# Patient Record
Sex: Male | Born: 1955 | Race: White | Hispanic: No | Marital: Married | Smoking: Never smoker
Health system: Southern US, Community
[De-identification: ages and names within clinical notes are randomized; demographics above are authoritative.]

## PROBLEM LIST (undated history)

## (undated) DIAGNOSIS — Z87442 Personal history of urinary calculi: Secondary | ICD-10-CM

## (undated) DIAGNOSIS — H9190 Unspecified hearing loss, unspecified ear: Secondary | ICD-10-CM

## (undated) DIAGNOSIS — G4700 Insomnia, unspecified: Secondary | ICD-10-CM

## (undated) DIAGNOSIS — K760 Fatty (change of) liver, not elsewhere classified: Secondary | ICD-10-CM

## (undated) DIAGNOSIS — E785 Hyperlipidemia, unspecified: Secondary | ICD-10-CM

## (undated) DIAGNOSIS — R42 Dizziness and giddiness: Secondary | ICD-10-CM

## (undated) DIAGNOSIS — E663 Overweight: Secondary | ICD-10-CM

## (undated) DIAGNOSIS — K2281 Esophageal polyp: Secondary | ICD-10-CM

## (undated) DIAGNOSIS — M1990 Unspecified osteoarthritis, unspecified site: Secondary | ICD-10-CM

## (undated) DIAGNOSIS — K228 Other specified diseases of esophagus: Secondary | ICD-10-CM

## (undated) DIAGNOSIS — G4730 Sleep apnea, unspecified: Secondary | ICD-10-CM

## (undated) DIAGNOSIS — I10 Essential (primary) hypertension: Secondary | ICD-10-CM

## (undated) DIAGNOSIS — K219 Gastro-esophageal reflux disease without esophagitis: Secondary | ICD-10-CM

## (undated) HISTORY — PX: TONSILLECTOMY: SUR1361

## (undated) HISTORY — DX: Fatty (change of) liver, not elsewhere classified: K76.0

## (undated) HISTORY — DX: Overweight: E66.3

## (undated) HISTORY — DX: Hyperlipidemia, unspecified: E78.5

## (undated) HISTORY — DX: Esophageal polyp: K22.81

## (undated) HISTORY — DX: Insomnia, unspecified: G47.00

---
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
## 2004-11-18 ENCOUNTER — Ambulatory Visit: Payer: Self-pay | Admitting: Gastroenterology

## 2005-02-13 ENCOUNTER — Inpatient Hospital Stay: Payer: Self-pay | Admitting: Internal Medicine

## 2005-02-13 ENCOUNTER — Other Ambulatory Visit: Payer: Self-pay

## 2005-03-02 ENCOUNTER — Encounter: Admission: RE | Admit: 2005-03-02 | Discharge: 2005-03-02 | Payer: Self-pay | Admitting: Internal Medicine

## 2007-08-14 ENCOUNTER — Ambulatory Visit: Payer: Self-pay | Admitting: General Practice

## 2009-05-28 ENCOUNTER — Ambulatory Visit: Payer: Self-pay | Admitting: General Practice

## 2009-10-16 ENCOUNTER — Ambulatory Visit: Payer: Self-pay | Admitting: General Practice

## 2009-10-28 ENCOUNTER — Ambulatory Visit: Payer: Self-pay | Admitting: General Practice

## 2009-11-02 ENCOUNTER — Ambulatory Visit: Payer: Self-pay | Admitting: General Practice

## 2012-06-12 ENCOUNTER — Ambulatory Visit: Payer: Self-pay | Admitting: Family Medicine

## 2013-02-08 ENCOUNTER — Ambulatory Visit: Payer: Self-pay | Admitting: Gastroenterology

## 2015-07-03 DIAGNOSIS — Z8709 Personal history of other diseases of the respiratory system: Secondary | ICD-10-CM | POA: Insufficient documentation

## 2015-07-03 DIAGNOSIS — K219 Gastro-esophageal reflux disease without esophagitis: Secondary | ICD-10-CM | POA: Insufficient documentation

## 2015-07-03 DIAGNOSIS — I10 Essential (primary) hypertension: Secondary | ICD-10-CM | POA: Insufficient documentation

## 2015-10-17 ENCOUNTER — Other Ambulatory Visit: Payer: Self-pay | Admitting: Nurse Practitioner

## 2015-10-17 DIAGNOSIS — R1030 Lower abdominal pain, unspecified: Secondary | ICD-10-CM

## 2015-10-23 ENCOUNTER — Ambulatory Visit
Admission: RE | Admit: 2015-10-23 | Discharge: 2015-10-23 | Disposition: A | Discharge status: Home / Self Care | Payer: BLUE CROSS/BLUE SHIELD | Source: Ambulatory Visit | Attending: Nurse Practitioner | Admitting: Nurse Practitioner

## 2015-10-23 DIAGNOSIS — R1030 Lower abdominal pain, unspecified: Secondary | ICD-10-CM | POA: Diagnosis not present

## 2015-10-23 DIAGNOSIS — K760 Fatty (change of) liver, not elsewhere classified: Secondary | ICD-10-CM | POA: Insufficient documentation

## 2015-10-23 HISTORY — DX: Essential (primary) hypertension: I10

## 2015-10-23 MED ORDER — IOHEXOL 300 MG/ML  SOLN
100.0000 mL | Freq: Once | INTRAMUSCULAR | Status: AC | PRN
  Administered 2015-10-23: 100 mL via INTRAVENOUS

## 2015-10-28 ENCOUNTER — Other Ambulatory Visit: Payer: Self-pay | Admitting: Nurse Practitioner

## 2015-10-28 DIAGNOSIS — R1031 Right lower quadrant pain: Secondary | ICD-10-CM

## 2015-10-28 DIAGNOSIS — K760 Fatty (change of) liver, not elsewhere classified: Secondary | ICD-10-CM

## 2015-10-28 DIAGNOSIS — R1032 Left lower quadrant pain: Secondary | ICD-10-CM

## 2015-10-31 ENCOUNTER — Ambulatory Visit
Admission: RE | Admit: 2015-10-31 | Discharge: 2015-10-31 | Disposition: A | Discharge status: Home / Self Care | Payer: BLUE CROSS/BLUE SHIELD | Source: Ambulatory Visit | Attending: Nurse Practitioner | Admitting: Nurse Practitioner

## 2015-10-31 DIAGNOSIS — R1031 Right lower quadrant pain: Secondary | ICD-10-CM | POA: Diagnosis not present

## 2015-10-31 DIAGNOSIS — R1032 Left lower quadrant pain: Secondary | ICD-10-CM | POA: Insufficient documentation

## 2015-10-31 DIAGNOSIS — K760 Fatty (change of) liver, not elsewhere classified: Secondary | ICD-10-CM | POA: Diagnosis present

## 2015-11-24 ENCOUNTER — Encounter: Payer: BLUE CROSS/BLUE SHIELD | Attending: Nurse Practitioner | Admitting: Dietician

## 2015-11-24 ENCOUNTER — Encounter: Payer: Self-pay | Admitting: Dietician

## 2015-11-24 VITALS — Ht 74.0 in | Wt 216.0 lb

## 2015-11-24 DIAGNOSIS — K760 Fatty (change of) liver, not elsewhere classified: Secondary | ICD-10-CM

## 2015-11-24 NOTE — Progress Notes (Signed)
Medical Nutrition Therapy: Visit start time: 1045  end time: 1140  Assessment:  Diagnosis: fatty liver disease Past medical history: HTN, GERD, sleep apnea Psychosocial issues/ stress concerns: none Preferred learning method:  . Hands-on  Current weight: 216lbs  Height: 6'2" Medications, supplements: reviewed list in chart with patient  Progress and evaluation: Patient reprots some weight loss, was 224lbs at the beginning of this year.         He reports diet changes to decrease fried foods, breads, and more fruits and vegetables.         He states he has not been following a healthy diet or lifestyle for the past 3 years.   Physical activity: none. Does farming/ greenhouse work during the day  Dietary Intake:  Usual eating pattern includes 3 meals and 2-3 snacks per day. Dining out frequency: 5 meals per week.  Breakfast: 6am 1 whole egg + 1 egg white, [place] bacon or sausage; or, toast with cheese or sugar free jelly; or grits or oatmeal. 1/2c orange juice or 1c milk.  Snack: coffee after breakfast; diet soda and Nabs  Lunch: grilled chicken or fish with baked potato, vegetables. Occasionally grilled chicken sandwich and chili at [employer]. Water Snack: diet decaf soda/ water, nabs Supper: sweet or white  potato, salad or green beans, lean beef or chicken Snack: fruit, almonds Beverages: water, diet soda, coffee.   Nutrition Care Education: Topics covered: weight management for fatty liver Basic nutrition: basic food groups, appropriate nutrient balance, appropriate meal and snack schedule, general nutrition guidelines    Weight control: benefits of weight control, 1700kcal meal plan for weight loss with 45%CHO, 25% protein, 30% fat; safe rate for weight loss   Options for healthy snacks   Nutritional Diagnosis:  [place]-3.3 Overweight/obesity As related to history of excess caloric intake.  As evidenced by patient report.  Intervention: Instruction as noted above.   Commended  patient for healthy changes made.   He reports he is motivated to continue with current eating pattern.    No follow-up scheduled at this time, encouraged patient to call as needed with questions or concerns.  Education Materials given:  . Food lists/ Planning A Balanced Meal . Sample meal pattern/ menus . Goals/ instructions  Learner/ who was taught:  . Patient   Level of understanding: Marland Kitchen. Verbalizes/ demonstrates competency  Demonstrated degree of understanding via:   Teach back Learning barriers: . None  Willingness to learn/ readiness for change: . Eager, change in progress  Monitoring and Evaluation:  Dietary intake, exercise, liver health, and body weight      follow up: prn

## 2015-11-24 NOTE — Patient Instructions (Signed)
   Choose healthy snacks, such as homemade snack mix with whole grain cereal (Cheerios or mini wheats) with a few nuts and dried fruit like raisins or cranberries; or low fat yogurt, or fruit with a few nuts or low fat cheese, or lowfat popcorn.   Keep up the healthy eating pattern you are now following.

## 2015-12-24 ENCOUNTER — Encounter: Payer: Self-pay | Admitting: *Deleted

## 2015-12-25 ENCOUNTER — Encounter: Payer: Self-pay | Admitting: *Deleted

## 2015-12-25 ENCOUNTER — Ambulatory Visit
Admission: RE | Admit: 2015-12-25 | Discharge: 2015-12-25 | Disposition: A | Discharge status: Home / Self Care | Payer: BLUE CROSS/BLUE SHIELD | Source: Ambulatory Visit | Attending: Gastroenterology | Admitting: Gastroenterology

## 2015-12-25 ENCOUNTER — Encounter
Admission: RE | Disposition: A | Discharge status: Home / Self Care | Payer: Self-pay | Source: Ambulatory Visit | Attending: Gastroenterology

## 2015-12-25 ENCOUNTER — Ambulatory Visit: Payer: BLUE CROSS/BLUE SHIELD | Admitting: Anesthesiology

## 2015-12-25 DIAGNOSIS — R194 Change in bowel habit: Secondary | ICD-10-CM | POA: Insufficient documentation

## 2015-12-25 DIAGNOSIS — K229 Disease of esophagus, unspecified: Secondary | ICD-10-CM | POA: Insufficient documentation

## 2015-12-25 DIAGNOSIS — Z882 Allergy status to sulfonamides status: Secondary | ICD-10-CM | POA: Insufficient documentation

## 2015-12-25 DIAGNOSIS — Z880 Allergy status to penicillin: Secondary | ICD-10-CM | POA: Insufficient documentation

## 2015-12-25 DIAGNOSIS — K3189 Other diseases of stomach and duodenum: Secondary | ICD-10-CM | POA: Diagnosis not present

## 2015-12-25 DIAGNOSIS — R1032 Left lower quadrant pain: Secondary | ICD-10-CM | POA: Insufficient documentation

## 2015-12-25 DIAGNOSIS — R933 Abnormal findings on diagnostic imaging of other parts of digestive tract: Secondary | ICD-10-CM | POA: Diagnosis not present

## 2015-12-25 DIAGNOSIS — Z79899 Other long term (current) drug therapy: Secondary | ICD-10-CM | POA: Insufficient documentation

## 2015-12-25 DIAGNOSIS — I10 Essential (primary) hypertension: Secondary | ICD-10-CM | POA: Diagnosis not present

## 2015-12-25 DIAGNOSIS — R197 Diarrhea, unspecified: Secondary | ICD-10-CM | POA: Insufficient documentation

## 2015-12-25 DIAGNOSIS — K219 Gastro-esophageal reflux disease without esophagitis: Secondary | ICD-10-CM | POA: Insufficient documentation

## 2015-12-25 DIAGNOSIS — Z888 Allergy status to other drugs, medicaments and biological substances status: Secondary | ICD-10-CM | POA: Insufficient documentation

## 2015-12-25 DIAGNOSIS — G4730 Sleep apnea, unspecified: Secondary | ICD-10-CM | POA: Diagnosis not present

## 2015-12-25 HISTORY — PX: ESOPHAGOGASTRODUODENOSCOPY (EGD) WITH PROPOFOL: SHX5813

## 2015-12-25 HISTORY — PX: COLONOSCOPY WITH PROPOFOL: SHX5780

## 2015-12-25 HISTORY — DX: Gastro-esophageal reflux disease without esophagitis: K21.9

## 2015-12-25 HISTORY — DX: Sleep apnea, unspecified: G47.30

## 2015-12-25 LAB — SURGICAL PATHOLOGY

## 2015-12-25 SURGERY — COLONOSCOPY WITH PROPOFOL
Anesthesia: General

## 2015-12-25 MED ORDER — FENTANYL CITRATE (PF) 100 MCG/2ML IJ SOLN
INTRAMUSCULAR | Status: DC | PRN
  Administered 2015-12-25: 50 ug via INTRAVENOUS

## 2015-12-25 MED ORDER — SODIUM CHLORIDE 0.9 % IV SOLN
INTRAVENOUS | Status: DC
  Administered 2015-12-25: 10:00:00 via INTRAVENOUS

## 2015-12-25 MED ORDER — EPHEDRINE SULFATE 50 MG/ML IJ SOLN
INTRAMUSCULAR | Status: DC | PRN
  Administered 2015-12-25: 5 mg via INTRAVENOUS

## 2015-12-25 MED ORDER — GLYCOPYRROLATE 0.2 MG/ML IJ SOLN
INTRAMUSCULAR | Status: DC | PRN
  Administered 2015-12-25: 0.1 mg via INTRAVENOUS

## 2015-12-25 MED ORDER — SODIUM CHLORIDE 0.9 % IV SOLN: INTRAVENOUS | Status: DC

## 2015-12-25 MED ORDER — PROPOFOL 500 MG/50ML IV EMUL
INTRAVENOUS | Status: DC | PRN
  Administered 2015-12-25: 100 ug/kg/min via INTRAVENOUS

## 2015-12-25 MED ORDER — MIDAZOLAM HCL 2 MG/2ML IJ SOLN
INTRAMUSCULAR | Status: DC | PRN
  Administered 2015-12-25: 1 mg via INTRAVENOUS

## 2015-12-25 MED ORDER — LIDOCAINE HCL (CARDIAC) 20 MG/ML IV SOLN
INTRAVENOUS | Status: DC | PRN
  Administered 2015-12-25: 30 mg via INTRAVENOUS

## 2015-12-25 NOTE — Op Note (Signed)
[facility] Gastroenterology Patient Name: Rodney Webb Procedure Date: 12/25/2015 9:47 AM MRN: [id number] Account #: [id number] Date of Birth: [birth year] Admit Type: Outpatient Age: 60 Room: [facility] ENDO ROOM 4 Gender: Male Note Status: Finalized Procedure:            Colonoscopy Indications:          Abdominal pain in the left lower quadrant, Change in                        bowel habits, Pain and diarrhea stopped once advil was                        stopped. Providers:            Ezzard Standing. Bluford Kaufmann, MD Referring MD:         Sallye Lat Md, MD (Referring MD) Medicines:            Monitored Anesthesia Care Complications:        No immediate complications. Procedure:            Pre-Anesthesia Assessment:                       - Prior to the procedure, a History and Physical was                        performed, and patient medications, allergies and                        sensitivities were reviewed. The patient's tolerance of                        previous anesthesia was reviewed.                       - The risks and benefits of the procedure and the                        sedation options and risks were discussed with the                        patient. All questions were answered and informed                        consent was obtained.                       - After reviewing the risks and benefits, the patient                        was deemed in satisfactory condition to undergo the                        procedure.                       After obtaining informed consent, the colonoscope was                        passed under direct vision. Throughout the procedure,  the patient's blood pressure, pulse, and oxygen                        saturations were monitored continuously. The                        Colonoscope was introduced through the anus and                        advanced to the the cecum, identified by appendiceal      orifice and ileocecal valve. The colonoscopy was                        performed without difficulty. The patient tolerated the                        procedure well. The quality of the bowel preparation                        was good. Findings:      The colon (entire examined portion) appeared normal. Biopsies for       histology were taken with a cold forceps from the entire colon for       evaluation of microscopic colitis. Impression:           - The entire examined colon is normal. Biopsied. Recommendation:       - Discharge patient to home.                       - Repeat colonoscopy in 10 years for surveillance.                       - The findings and recommendations were discussed with                        the patient. Procedure Code(s):    --- Professional ---                       [phone], Colonoscopy, flexible; with biopsy, single or                        multiple Diagnosis Code(s):    --- Professional ---                       R10.32, Left lower quadrant pain                       R19.4, Change in bowel habit CPT copyright 2016 American Medical Association. All rights reserved. The codes documented in this report are preliminary and upon coder review may  be revised to meet current compliance requirements. Wallace CullensPaul Y Chisom Aust, MD 12/25/2015 10:13:08 AM This report has been signed electronically. Number of Addenda: 0 Note Initiated On: 12/25/2015 9:47 AM Scope Withdrawal Time: 0 hours 5 minutes 0 seconds  Total Procedure Duration: 0 hours 6 minutes 38 seconds       [facility]

## 2015-12-25 NOTE — H&P (Signed)
    Primary Care Physician:  Gilles ChiquitoJoseph H Rabinowitz, MD Primary Gastroenterologist:  Dr. Bluford Kaufmannh  Pre-Procedure History & Physical: HPI:  Rodney Webb is a 60 y.o. male is here for an EGD/colonoscopy   Past Medical History  Diagnosis Date  . Hypertension   . GERD (gastroesophageal reflux disease)   . Sleep apnea     uses CPAP    Past Surgical History  Procedure Laterality Date  . Tonsillectomy      Prior to Admission medications   Medication Sig Start Date End Date Taking? Authorizing Provider  bisoprolol-hydrochlorothiazide (ZIAC) 10-6.25 MG tablet Take 1 tablet by mouth daily.   Yes Historical Provider, MD  bisoprolol (ZEBETA) 10 MG tablet Take by mouth.    Historical Provider, MD  esomeprazole (NEXIUM) 20 MG capsule Take by mouth. 10/17/15   Historical Provider, MD  fluticasone (FLONASE) 50 MCG/ACT nasal spray Place into the nose.    Historical Provider, MD  loratadine (CLARITIN) 10 MG tablet Take by mouth.    Historical Provider, MD  Misc Natural Products (OSTEO BI-FLEX ADV DOUBLE ST PO)     Historical Provider, MD    Allergies as of 12/16/2015 - Review Complete 11/24/2015  Allergen Reaction Noted  . Penicillin g Itching 11/24/2015  . Sulfa antibiotics Rash and Itching 10/23/2015  . Pantoprazole Diarrhea 11/24/2015  . Penicillins Rash 10/23/2015    History reviewed. No pertinent family history.  Social History   Social History  . Marital Status: Married    Spouse Name: N/A  . Number of Children: N/A  . Years of Education: N/A   Occupational History  . Not on file.   Social History Main Topics  . Smoking status: Never Smoker   . Smokeless tobacco: Never Used  . Alcohol Use: No  . Drug Use: No  . Sexual Activity: Not on file   Other Topics Concern  . Not on file   Social History Narrative    Review of Systems: See HPI, otherwise negative ROS  Physical Exam: BP 136/88 mmHg  Pulse 45  Temp(Src) 96.3 F (35.7 C) (Tympanic)  Resp 18  Ht 6\' 2"  (1.88 m)   Wt 90.719 kg (200 lb)  BMI 25.67 kg/m2  SpO2 100% General:   Alert,  pleasant and cooperative in NAD Head:  Normocephalic and atraumatic. Neck:  Supple; no masses or thyromegaly. Lungs:  Clear throughout to auscultation.    Heart:  Regular rate and rhythm. Abdomen:  Soft, nontender and nondistended. Normal bowel sounds, without guarding, and without rebound.   Neurologic:  Alert and  oriented x4;  grossly normal neurologically.  Impression/Plan: Rodney Webb is here for an EGD/colonoscopy to be performed for LLQ pain, changes in bowel habits, GERD, cirrhosis  Risks, benefits, limitations, and alternatives regarding EGD/colonoscopy have been reviewed with the patient.  Questions have been answered.  All parties agreeable.   Rodney Webb, Ezzard StandingPAUL Y, MD  12/25/2015, 9:47 AM

## 2015-12-25 NOTE — Anesthesia Postprocedure Evaluation (Signed)
Anesthesia Post Note  Patient: Rodney Webb  Procedure(s) Performed: Procedure(s) (LRB): COLONOSCOPY WITH PROPOFOL (N/A) ESOPHAGOGASTRODUODENOSCOPY (EGD) WITH PROPOFOL (N/A)  Patient location during evaluation: Endoscopy Anesthesia Type: General Level of consciousness: awake and alert Pain management: pain level controlled Vital Signs Assessment: post-procedure vital signs reviewed and stable Respiratory status: spontaneous breathing, nonlabored ventilation, respiratory function stable and patient connected to nasal cannula oxygen Cardiovascular status: blood pressure returned to baseline and stable Postop Assessment: no signs of nausea or vomiting Anesthetic complications: no    Last Vitals:  Filed Vitals:   12/25/15 1038 12/25/15 1048  BP: 119/79 115/89  Pulse: 48 45  Temp:    Resp: 15 9    Last Pain: There were no vitals filed for this visit.               Rodney Webb

## 2015-12-25 NOTE — Anesthesia Preprocedure Evaluation (Signed)
Anesthesia Evaluation  Patient identified by MRN, date of birth, ID band Patient awake    Reviewed: Allergy & Precautions, H&P , NPO status , Patient's Chart, lab work & pertinent test results  History of Anesthesia Complications Negative for: history of anesthetic complications  Airway Mallampati: III  TM Distance: >3 FB Neck ROM: limited    Dental  (+) Poor Dentition, Chipped   Pulmonary neg shortness of breath, sleep apnea ,    Pulmonary exam normal breath sounds clear to auscultation       Cardiovascular Exercise Tolerance: Good hypertension, (-) angina(-) Past MI and (-) DOE Normal cardiovascular exam Rhythm:regular Rate:Normal     Neuro/Psych negative neurological ROS  negative psych ROS   GI/Hepatic Neg liver ROS, GERD  Controlled,  Endo/Other  negative endocrine ROS  Renal/GU negative Renal ROS  negative genitourinary   Musculoskeletal   Abdominal   Peds  Hematology negative hematology ROS (+)   Anesthesia Other Findings Past Medical History:   Hypertension                                                 GERD (gastroesophageal reflux disease)                       Sleep apnea                                                    Comment:uses CPAP  Past Surgical History:   TONSILLECTOMY                                                BMI    Body Mass Index   25.66 kg/m 2      Reproductive/Obstetrics negative OB ROS                             Anesthesia Physical Anesthesia Plan  ASA: III  Anesthesia Plan: General   Post-op Pain Management:    Induction:   Airway Management Planned:   Additional Equipment:   Intra-op Plan:   Post-operative Plan:   Informed Consent: I have reviewed the patients History and Physical, chart, labs and discussed the procedure including the risks, benefits and alternatives for the proposed anesthesia with the patient or authorized  representative who has indicated his/her understanding and acceptance.   Dental Advisory Given  Plan Discussed with: Anesthesiologist, CRNA and Surgeon  Anesthesia Plan Comments:         Anesthesia Quick Evaluation

## 2015-12-25 NOTE — Anesthesia Procedure Notes (Signed)
Performed by: COOK-MARTIN, Drequan Ironside Pre-anesthesia Checklist: Patient identified, Emergency Drugs available, Suction available, Patient being monitored and Timeout performed Patient Re-evaluated:Patient Re-evaluated prior to inductionOxygen Delivery Method: Nasal cannula Preoxygenation: Pre-oxygenation with 100% oxygen Intubation Type: IV induction Airway Equipment and Method: Bite block Placement Confirmation: positive ETCO2 and CO2 detector     

## 2015-12-25 NOTE — Transfer of Care (Signed)
Immediate Anesthesia Transfer of Care Note  Patient: Rodney Webb  Procedure(s) Performed: Procedure(s): COLONOSCOPY WITH PROPOFOL (N/A) ESOPHAGOGASTRODUODENOSCOPY (EGD) WITH PROPOFOL (N/A)  Patient Location: PACU  Anesthesia Type:General  Level of Consciousness: awake and sedated  Airway & Oxygen Therapy: Patient Spontanous Breathing and Patient connected to nasal cannula oxygen  Post-op Assessment: Report given to RN and Post -op Vital signs reviewed and stable  Post vital signs: Reviewed and stable  Last Vitals:  Filed Vitals:   12/25/15 0921  BP: 136/88  Pulse: 45  Temp: 35.7 C  Resp: 18    Complications: No apparent anesthesia complications

## 2015-12-25 NOTE — Op Note (Signed)
[facility] Gastroenterology Patient Name: Rodney Webb Procedure Date: 12/25/2015 9:49 AM MRN: [id number] Account #: [id number] Date of Birth: 01/12/1956 Admit Type: Outpatient Age: 60 [facility] ENDO ROOM 4 Gender: Male Note Status: Finalized Procedure:            Upper GI endoscopy Indications:          Suspected esophageal reflux, Abnormal CT of the GI                        tract, possible cirrhosis on CT Providers:            Ezzard StandingPaul Y. Bluford Kaufmannh, MD Referring MD:         Sallye LatNo Local Md, MD (Referring MD) Medicines:            Monitored Anesthesia Care Complications:        No immediate complications. Procedure:            Pre-Anesthesia Assessment:                       - Prior to the procedure, a History and Physical was                        performed, and patient medications, allergies and                        sensitivities were reviewed. The patient's tolerance of                        previous anesthesia was reviewed.                       - The risks and benefits of the procedure and the                        sedation options and risks were discussed with the                        patient. All questions were answered and informed                        consent was obtained.                       After obtaining informed consent, the endoscope was                        passed under direct vision. Throughout the procedure,                        the patient's blood pressure, pulse, and oxygen                        saturations were monitored continuously. The Endoscope                        was introduced through the mouth, and advanced to the                        second part of duodenum. The upper GI endoscopy was  accomplished without difficulty. The patient tolerated                        the procedure well. Findings:      A single small nodule was found at the gastroesophageal junction.       Biopsies were taken with a cold  forceps for histology.      The exam was otherwise without abnormality.      A single small submucosal papule (nodule) was found in the gastric       antrum.      The exam was otherwise without abnormality.      The examined duodenum was normal. Impression:           - Nodule found in the esophagus. Biopsied.                       - The examination was otherwise normal.                       - A single submucosal papule (nodule) found in the                        stomach.                       - The examination was otherwise normal.                       - Normal examined duodenum. Recommendation:       - Discharge patient to home.                       - Await pathology results.                       - Continue present medications.                       - The findings and recommendations were discussed with                        the patient.                       - Consider repeating EGD in 1 yr. If antral nodule                        still present, then EUS Procedure Code(s):    --- Professional ---                       [phone], Esophagogastroduodenoscopy, flexible, transoral;                        with biopsy, single or multiple Diagnosis Code(s):    --- Professional ---                       K22.8, Other specified diseases of esophagus                       K31.89, Other diseases of stomach and duodenum  R93.3, Abnormal findings on diagnostic imaging of other                        parts of digestive tract CPT copyright 2016 American Medical Association. All rights reserved. The codes documented in this report are preliminary and upon coder review may  be revised to meet current compliance requirements. Wallace Cullens, MD 12/25/2015 9:58:39 AM This report has been signed electronically. Number of Addenda: 0 Note Initiated On: 12/25/2015 9:49 AM      [facility]

## 2015-12-29 ENCOUNTER — Encounter: Payer: Self-pay | Admitting: Gastroenterology

## 2016-02-23 ENCOUNTER — Encounter (INDEPENDENT_AMBULATORY_CARE_PROVIDER_SITE_OTHER): Payer: Self-pay

## 2016-02-23 ENCOUNTER — Encounter: Payer: Self-pay | Admitting: Gastroenterology

## 2016-02-23 ENCOUNTER — Other Ambulatory Visit: Payer: Self-pay

## 2016-02-23 ENCOUNTER — Ambulatory Visit (INDEPENDENT_AMBULATORY_CARE_PROVIDER_SITE_OTHER): Payer: BLUE CROSS/BLUE SHIELD | Admitting: Gastroenterology

## 2016-02-23 VITALS — BP 132/82 | HR 56 | Temp 98.6°F | Ht 74.0 in | Wt 202.0 lb

## 2016-02-23 DIAGNOSIS — R935 Abnormal findings on diagnostic imaging of other abdominal regions, including retroperitoneum: Secondary | ICD-10-CM

## 2016-02-23 DIAGNOSIS — K7460 Unspecified cirrhosis of liver: Secondary | ICD-10-CM

## 2016-02-23 NOTE — Progress Notes (Signed)
Gastroenterology Consultation  Referring Provider:     Gilles Chiquito, MD Primary Care Physician:  Rodney Chiquito, MD Primary Gastroenterologist:  Dr. Servando Webb     Reason for Consultation:    Questionable Cirrhosis        HPI:   Rodney Webb is a 60 y.o. y/o male referred for consultation & management of Questionable cirrhosis by Dr. Gilles Chiquito, MD.  This patient comes in today after being followed by the [facility] clinic.  The patient was seen by the nurse practitioner who told the patient that he should be evaluated for liver transplant.  The patient has been found to have a fibrosis scan that showed him to have findings consistent with F3-F4 fibrosis.  The patient has had no history of abnormal liver enzymes or liver problems in the past.  He denies any alcohol abuse.  He also denies any family history of liver disease.  The patient has been in his normal state of health and had his hepatitis panel come back negative.  There is also no report of any jaundice.  He was told that the cause of his liver disease was fatty liver despite the lack of obesity.  The patient felt like he was not understanding his disease nor did he understand why he was being sent for a liver transplant evaluation.  Therefore the patient is now here to see me for a second opinion.  The patient has had an EGD and colonoscopy without any report of any varices at the time of the upper endoscopy.  The patient's albumin and platelets have been normal as well as all of his other labs.  Past Medical History  Diagnosis Date  . Hypertension   . GERD (gastroesophageal reflux disease)   . Sleep apnea     uses CPAP    Past Surgical History  Procedure Laterality Date  . Tonsillectomy    . Colonoscopy with propofol N/A 12/25/2015    Procedure: COLONOSCOPY WITH PROPOFOL;  Surgeon: Rodney Cullens, MD;  Location: [facility] ENDOSCOPY;  Service: Gastroenterology;  Laterality: N/A;  . Esophagogastroduodenoscopy (egd) with  propofol N/A 12/25/2015    Procedure: ESOPHAGOGASTRODUODENOSCOPY (EGD) WITH PROPOFOL;  Surgeon: Rodney Cullens, MD;  Location: [facility] ENDOSCOPY;  Service: Gastroenterology;  Laterality: N/A;    Prior to Admission medications   Medication Sig Start Date End Date Taking? Authorizing Provider  bisoprolol-hydrochlorothiazide (ZIAC) 10-6.25 MG tablet Take 1 tablet by mouth daily.   Yes Historical Provider, MD  esomeprazole (NEXIUM) 20 MG capsule Take by mouth. 10/17/15  Yes Historical Provider, MD  fluticasone (FLONASE) 50 MCG/ACT nasal spray Place into the nose.   Yes Historical Provider, MD  loratadine (CLARITIN) 10 MG tablet Take by mouth.   Yes Historical Provider, MD  Misc Natural Products (OSTEO BI-FLEX ADV DOUBLE ST PO)    Yes Historical Provider, MD  bisoprolol (ZEBETA) 10 MG tablet Take by mouth. Reported on 02/23/2016    Historical Provider, MD  ranitidine (ZANTAC) 150 MG tablet Take by mouth. Reported on 02/23/2016 01/15/16 01/14/17  Historical Provider, MD  ranitidine (ZANTAC) 150 MG tablet Reported on 02/23/2016 01/15/16   Historical Provider, MD  traMADol Janean Sark) 50 MG tablet Reported on 02/23/2016 01/19/16   Historical Provider, MD    Family History  Problem Relation Age of Onset  . Diabetes Mother   . Stroke Father      Social History  Substance Use Topics  . Smoking status: Never Smoker   . Smokeless tobacco:  Former NeurosurgeonUser  . Alcohol Use: No    Allergies as of 02/23/2016 - Review Complete 02/23/2016  Allergen Reaction Noted  . Penicillin g Itching 11/24/2015  . Sulfa antibiotics Rash and Itching 10/23/2015  . Advil [ibuprofen]  12/25/2015  . Pantoprazole Diarrhea 11/24/2015  . Penicillins Rash 10/23/2015    Review of Systems:    All systems reviewed and negative except where noted in HPI.   Physical Exam:  BP 132/82 mmHg  Pulse 56  Temp(Src) 98.6 F (37 C) (Oral)  Ht 6\' 2"  (1.88 m)  Wt 202 lb (91.627 kg)  BMI 25.92 kg/m2 No LMP for male patient. Psych:  Alert and cooperative.  Normal mood and affect. General:   Alert,  Well-developed, well-nourished, pleasant and cooperative in NAD Head:  Normocephalic and atraumatic. Eyes:  Sclera clear, no icterus.   Conjunctiva pink. Ears:  Normal auditory acuity. Nose:  No deformity, discharge, or lesions. Mouth:  No deformity or lesions,oropharynx pink & moist. Neck:  Supple; no masses or thyromegaly. Lungs:  Respirations even and unlabored.  Clear throughout to auscultation.   No wheezes, crackles, or rhonchi. No acute distress. Heart:  Regular rate and rhythm; no murmurs, clicks, rubs, or gallops. Abdomen:  Normal bowel sounds.  No bruits.  Soft, non-tender and non-distended without masses, hepatosplenomegaly or hernias noted.  No guarding or rebound tenderness.  Negative Carnett sign.   Rectal:  Deferred.  Msk:  Symmetrical without gross deformities.  Good, equal movement & strength bilaterally. Pulses:  Normal pulses noted. Extremities:  No clubbing or edema.  No cyanosis. Neurologic:  Alert and oriented x3;  grossly normal neurologically. Skin:  Intact without significant lesions or rashes.  No jaundice. Lymph Nodes:  No significant cervical adenopathy. Psych:  Alert and cooperative. Normal mood and affect.  Imaging Studies: No results found.  Assessment and Plan:   Rodney Webb is a 60 y.o. y/o male Who comes in today with a report of a fibrosis scan consistent with F3-F4.  If this is correct the patient has compensated liver disease.  The patient's markers such as his INR albumin and platelets are all normal.  The patient has no history of encephalopathy, ascites or other markers of decompensation.  His upper endoscopy by Dr. Bluford Kaufmannh did not mention any varices.  After reviewing the labs it does not appear that the patient had a workup to look for any other possible causes of his possible cirrhosis.  The patient will have labs sent off to look for other possible causes.  If everything is negative the patient may need a  liver biopsy to ascertain whether there is active progression of his disease since his liver enzymes are normal. The patient will also continue his  PPI and he has been told that he can take Tylenol up to what is written on the directions as long as he is not drinking alcohol while taking the medication. The patient and his wife have been explained the plan and agree with it.   Note: This dictation was prepared with Dragon dictation along with smaller phrase technology. Any transcriptional errors that result from this process are unintentional.

## 2016-02-24 LAB — AFP TUMOR MARKER: AFP-Tumor Marker: 2.2 ng/mL (ref 0.0–8.3)

## 2016-02-24 LAB — ALPHA-1-ANTITRYPSIN: A-1 Antitrypsin: 126 mg/dL (ref 90–200)

## 2016-02-24 LAB — CERULOPLASMIN: Ceruloplasmin: 26.1 mg/dL (ref 16.0–31.0)

## 2016-02-24 LAB — ANTI-SMOOTH MUSCLE ANTIBODY, IGG: Smooth Muscle Ab: 40 Units — ABNORMAL HIGH (ref 0–19)

## 2016-02-24 LAB — FERRITIN: Ferritin: 170 ng/mL (ref 30–400)

## 2016-02-24 LAB — MITOCHONDRIAL ANTIBODIES: Mitochondrial Ab: 5 Units (ref 0.0–20.0)

## 2016-03-05 ENCOUNTER — Telehealth: Payer: Self-pay

## 2016-03-05 NOTE — Telephone Encounter (Signed)
Pt notified of lab results. Will set pt up for a liver biopsy.

## 2016-03-05 NOTE — Telephone Encounter (Signed)
-----   Message from Midge Miniumarren Wohl, MD sent at 03/04/2016 12:59 PM EDT ----- Let the patient know that one of his blood test came back or a possible cause of his abnormal liver enzymes but this needs to be confirmed with a liver biopsy. This will also tell us how extensive his liver damage really is and the best way to treat it.

## 2016-03-09 ENCOUNTER — Other Ambulatory Visit: Payer: Self-pay

## 2016-03-09 DIAGNOSIS — R748 Abnormal levels of other serum enzymes: Secondary | ICD-10-CM

## 2016-03-12 ENCOUNTER — Telehealth: Payer: Self-pay

## 2016-03-12 NOTE — Telephone Encounter (Signed)
Pt scheduled for a US liver biopsy at [facility] on Monday, July 10th @ 9:30am. Pt has been instructed to be NPO after midnight and to arrive at 8:30am at the medical mall.

## 2016-03-19 ENCOUNTER — Other Ambulatory Visit: Payer: Self-pay | Admitting: Radiology

## 2016-03-22 ENCOUNTER — Ambulatory Visit
Admission: RE | Admit: 2016-03-22 | Discharge: 2016-03-22 | Disposition: A | Discharge status: Home / Self Care | Payer: BLUE CROSS/BLUE SHIELD | Source: Ambulatory Visit | Attending: Gastroenterology | Admitting: Gastroenterology

## 2016-03-22 DIAGNOSIS — Z882 Allergy status to sulfonamides status: Secondary | ICD-10-CM | POA: Insufficient documentation

## 2016-03-22 DIAGNOSIS — K219 Gastro-esophageal reflux disease without esophagitis: Secondary | ICD-10-CM | POA: Insufficient documentation

## 2016-03-22 DIAGNOSIS — Z880 Allergy status to penicillin: Secondary | ICD-10-CM | POA: Diagnosis not present

## 2016-03-22 DIAGNOSIS — G4730 Sleep apnea, unspecified: Secondary | ICD-10-CM | POA: Insufficient documentation

## 2016-03-22 DIAGNOSIS — Z888 Allergy status to other drugs, medicaments and biological substances status: Secondary | ICD-10-CM | POA: Diagnosis not present

## 2016-03-22 DIAGNOSIS — Z823 Family history of stroke: Secondary | ICD-10-CM | POA: Diagnosis not present

## 2016-03-22 DIAGNOSIS — R748 Abnormal levels of other serum enzymes: Secondary | ICD-10-CM | POA: Insufficient documentation

## 2016-03-22 DIAGNOSIS — Z833 Family history of diabetes mellitus: Secondary | ICD-10-CM | POA: Diagnosis not present

## 2016-03-22 DIAGNOSIS — I10 Essential (primary) hypertension: Secondary | ICD-10-CM | POA: Diagnosis not present

## 2016-03-22 DIAGNOSIS — R932 Abnormal findings on diagnostic imaging of liver and biliary tract: Secondary | ICD-10-CM | POA: Insufficient documentation

## 2016-03-22 DIAGNOSIS — Z886 Allergy status to analgesic agent status: Secondary | ICD-10-CM | POA: Diagnosis not present

## 2016-03-22 LAB — CBC
HCT: 44.8 % (ref 40.0–52.0)
Hemoglobin: 15.7 g/dL (ref 13.0–18.0)
MCH: 32.3 pg (ref 26.0–34.0)
MCHC: 35.1 g/dL (ref 32.0–36.0)
MCV: 91.9 fL (ref 80.0–100.0)
Platelets: 200 10*3/uL (ref 150–440)
RBC: 4.87 MIL/uL (ref 4.40–5.90)
RDW: 12.7 % (ref 11.5–14.5)
WBC: 5.8 10*3/uL (ref 3.8–10.6)

## 2016-03-22 LAB — PROTIME-INR
INR: 1
Prothrombin Time: 13.4 seconds (ref 11.4–15.0)

## 2016-03-22 LAB — APTT: aPTT: 26 seconds (ref 24–36)

## 2016-03-22 MED ORDER — MIDAZOLAM HCL 2 MG/2ML IJ SOLN
INTRAMUSCULAR | Status: AC | PRN
  Administered 2016-03-22: 1 mg via INTRAVENOUS

## 2016-03-22 MED ORDER — SODIUM CHLORIDE 0.9 % IV SOLN: Freq: Once | INTRAVENOUS | Status: DC

## 2016-03-22 MED ORDER — FENTANYL CITRATE (PF) 100 MCG/2ML IJ SOLN
INTRAMUSCULAR | Status: AC | PRN
  Administered 2016-03-22: 50 ug via INTRAVENOUS

## 2016-03-22 MED ORDER — HYDROCODONE-ACETAMINOPHEN 5-325 MG PO TABS
1.0000 | ORAL_TABLET | Freq: Once | ORAL | Status: AC
  Administered 2016-03-22: 1 via ORAL
  Filled 2016-03-22: qty 1

## 2016-03-22 NOTE — Procedures (Signed)
Successful US random liver bx No comp Stable Path pending Full report in PACS

## 2016-03-22 NOTE — Discharge Instructions (Signed)
Liver Biopsy, Care After °Refer to this sheet in the next few weeks. These instructions provide you with information on caring for yourself after your procedure. Your health care provider may also give you more specific instructions. Your treatment has been planned according to current medical practices, but problems sometimes occur. Call your health care provider if you have any problems or questions after your procedure. °WHAT TO EXPECT AFTER THE PROCEDURE °After your procedure, it is typical to have the following: °· A small amount of discomfort in the area where the biopsy was done and in the right shoulder or shoulder blade. °· A small amount of bruising around the area where the biopsy was done and on the skin over the liver. °· Sleepiness and fatigue for the rest of the day. °HOME CARE INSTRUCTIONS  °· Rest at home for 1-2 days or as directed by your health care provider. °· Have a friend or family member stay with you for at least 24 hours. °· Because of the medicines used during the procedure, you should not do the following things in the first 24 hours: °¨ Drive. °¨ Use machinery. °¨ Be responsible for the care of other people. °¨ Sign legal documents. °¨ Take a bath or shower. °· There are many different ways to close and cover an incision, including stitches, skin glue, and adhesive strips. Follow your health care provider's instructions on: °¨ Incision care. °¨ Bandage (dressing) changes and removal. °¨ Incision closure removal. °· Do not drink alcohol in the first week. °· Do not lift more than 5 pounds or play contact sports for 2 weeks after this test. °· Take medicines only as directed by your health care provider. Do not take medicine containing aspirin or non-steroidal anti-inflammatory medicines such as ibuprofen for 1 week after this test. °· It is your responsibility to get your test results. °SEEK MEDICAL CARE IF:  °· You have increased bleeding from an incision that results in more than a  small spot of blood. °· You have redness, swelling, or increasing pain in any incisions. °· You notice a discharge or a bad smell coming from any of your incisions. °· You have a fever or chills. °SEEK IMMEDIATE MEDICAL CARE IF:  °· You develop swelling, bloating, or pain in your abdomen. °· You become dizzy or faint. °· You develop a rash. °· You are nauseous or vomit. °· You have difficulty breathing, feel short of breath, or feel faint. °· You develop chest pain. °· You have problems with your speech or vision. °· You have trouble balancing or moving your arms or legs. °  °This information is not intended to replace advice given to you by your health care provider. Make sure you discuss any questions you have with your health care provider. °  °Document Released: 03/19/2005 Document Revised: 09/20/2014 Document Reviewed: 10/26/2013 °Elsevier Interactive Patient Education ©2016 [employer]. ° °

## 2016-03-22 NOTE — H&P (Signed)
Chief Complaint:  Abnormal hepatic Elastography, concerning for hepatic steatosis, request for liver biopsy.  Referring Physician(s): Wohl,Darren  History of Present Illness: Kizzie FantasiaJoseph R An is a 60 y.o. male with a prior history of hypertension, gastroesophageal reflux disease, and sleep apnea. Mild fatty liver incidentally found by CT. Hepatic Elastography performed suggesting mild fibrosis. Patient remains asymptomatic. No significant flank or abdominal pain. No signs of jaundice or encephalopathy. No history of GI bleeding. He is here for outpatient ultrasound liver biopsy today.   Past Medical History  Diagnosis Date  . Hypertension   . GERD (gastroesophageal reflux disease)   . Sleep apnea     uses CPAP    Past Surgical History  Procedure Laterality Date  . Tonsillectomy    . Colonoscopy with propofol N/A 12/25/2015    Procedure: COLONOSCOPY WITH PROPOFOL;  Surgeon: Wallace CullensPaul Y Oh, MD;  Location[facility] ENDOSCOPY;  Service: Gastroenterology;  Laterality: N/A;  . Esophagogastroduodenoscopy (egd) with propofol N/A 12/25/2015    Procedure: ESOPHAGOGASTRODUODENOSCOPY (EGD) WITH PROPOFOL;  Surgeon: Wallace CullensPaul Y Oh, MD;  Location[facility] ENDOSCOPY;  Service: Gastroenterology;  Laterality: N/A;    Allergies: Sulfa antibiotics; Advil; Pantoprazole; and Penicillins  Medications: Prior to Admission medications   Medication Sig Start Date End Date Taking? Authorizing Provider  bisoprolol (ZEBETA) 10 MG tablet Take by mouth. Reported on 02/23/2016   Yes Historical Provider, MD  bisoprolol-hydrochlorothiazide (ZIAC) 10-6.25 MG tablet Take 1 tablet by mouth daily.   Yes Historical Provider, MD  esomeprazole (NEXIUM) 20 MG capsule Take by mouth daily.  10/17/15  Yes Historical Provider, MD  fluticasone (FLONASE) 50 MCG/ACT nasal spray Place 2 sprays into both nostrils daily as needed.    Yes Historical Provider, MD  Melatonin 10 MG TABS Take 10 mg by mouth at bedtime as needed.   Yes Historical  Provider, MD  loratadine (CLARITIN) 10 MG tablet Take by mouth.    Historical Provider, MD  Misc Natural Products (OSTEO BI-FLEX ADV DOUBLE ST PO)     Historical Provider, MD  ranitidine (ZANTAC) 150 MG tablet Take by mouth. Reported on 02/23/2016 01/15/16 01/14/17  Historical Provider, MD  ranitidine (ZANTAC) 150 MG tablet Reported on 02/23/2016 01/15/16   Historical Provider, MD  traMADol Janean Sark(ULTRAM) 50 MG tablet Reported on 02/23/2016 01/19/16   Historical Provider, MD     Family History  Problem Relation Age of Onset  . Diabetes Mother   . Stroke Father     Social History   Social History  . Marital Status: Married    Spouse Name: N/A  . Number of Children: N/A  . Years of Education: N/A   Social History Main Topics  . Smoking status: Never Smoker   . Smokeless tobacco: Former NeurosurgeonUser  . Alcohol Use: No  . Drug Use: No  . Sexual Activity: Not Asked   Other Topics Concern  . None   Social History Narrative     Review of Systems: A 12 point ROS discussed and pertinent positives are indicated in the HPI above.  All other systems are negative.  Review of Systems  Vital Signs: BP 130/96 mmHg  Pulse 56  Temp(Src) 98.1 F (36.7 C) (Oral)  Resp 16  Ht 6\' 2"  (1.88 m)  Wt 202 lb (91.627 kg)  BMI 25.92 kg/m2  SpO2 99%  Physical Exam  Constitutional: He is oriented to person, place, and time. He appears well-developed and well-nourished. No distress.  Eyes: No scleral icterus.  Cardiovascular: Normal rate and regular  rhythm.   No murmur heard. Pulmonary/Chest: Effort normal and breath sounds normal. No respiratory distress.  Abdominal: Soft. Bowel sounds are normal. He exhibits no distension and no mass. There is no guarding.  Neurological: He is alert and oriented to person, place, and time.  No signs of encephalopathy.  Skin: Skin is warm and dry. No rash noted. He is not diaphoretic. No erythema.  Psychiatric: He has a normal mood and affect. His behavior is normal.     Mallampati Score:   1  Imaging: No results found.  Labs:  CBC:  Recent Labs  03/22/16 0854  WBC 5.8  HGB 15.7  HCT 44.8  PLT 200    COAGS: No results for input(s): INR, APTT in the last 8760 hours.  BMP: No results for input(s): NA, K, CL, CO2, GLUCOSE, BUN, CALCIUM, CREATININE, GFRNONAA, GFRAA in the last 8760 hours.  Invalid input(s): CMP  LIVER FUNCTION TESTS: No results for input(s): BILITOT, AST, ALT, ALKPHOS, PROT, ALBUMIN in the last 8760 hours.  TUMOR MARKERS:  Recent Labs  02/23/16 1631  AFPTM 2.2    Assessment and Plan:  60 year old male being evaluated for nonalcoholic steatohepatitis. Recent imaging suggests mild fibrosis. He remains asymptomatic. Plan for outpatient elective ultrasound liver biopsy today.  Risks and Benefits discussed with the patient including, but not limited to bleeding, infection, damage to adjacent structures or low yield requiring additional tests. All of the patient's questions were answered, patient is agreeable to proceed. Consent signed and in chart.    Thank you for this interesting consult.  I greatly enjoyed meeting DAIWIK BUFFALO and look forward to participating in their care.  A copy of this report was sent to the requesting provider on this date.  Electronically Signed: Berdine Dance 03/22/2016, 9:18 AM   I spent a total of  15 Minutes   in face to face in clinical consultation, greater than 50% of which was counseling/coordinating care for this patient being evaluated for hepatic steatosis.

## 2016-03-24 LAB — SURGICAL PATHOLOGY

## 2016-03-29 ENCOUNTER — Telehealth: Payer: Self-pay

## 2016-03-29 NOTE — Telephone Encounter (Signed)
Please review pt's liver biopsy results. Doesn't look like it was resulted to your box.

## 2016-03-30 NOTE — Telephone Encounter (Signed)
Let the patient know that his liver biopsy showed normal liver tissue without any signs of cirrhosis or inflammation. There is no sign of any fatty liver or other abnormalities.

## 2016-03-30 NOTE — Telephone Encounter (Signed)
Pt notified of liver biopsy results.  

## 2016-06-14 ENCOUNTER — Encounter: Payer: Self-pay | Admitting: Gastroenterology

## 2016-06-14 ENCOUNTER — Ambulatory Visit (INDEPENDENT_AMBULATORY_CARE_PROVIDER_SITE_OTHER): Payer: BLUE CROSS/BLUE SHIELD | Admitting: Gastroenterology

## 2016-06-14 ENCOUNTER — Other Ambulatory Visit: Payer: Self-pay

## 2016-06-14 VITALS — BP 128/83 | HR 54 | Temp 98.8°F | Ht 74.0 in | Wt 208.0 lb

## 2016-06-14 DIAGNOSIS — R197 Diarrhea, unspecified: Secondary | ICD-10-CM

## 2016-06-14 NOTE — Progress Notes (Signed)
   Primary Care Physician: Gilles ChiquitoJoseph H Rabinowitz, MD  Primary Gastroenterologist:  Dr. Midge Miniumarren Marliss Buttacavoli  Chief Complaint  Patient presents with  . Diarrhea    HPI: Rodney Webb is a 60 y.o. male here for Diarrhea. The patient was seen in the past by Dr. Bluford Kaufmannoh for the same thing but it was attributed to his antibiotic medications and by the time he had a colonoscopy the diarrhea had stopped. The patient then had biopsies throughout his colon that did not show any cause for diarrhea. The patient states he will now have the diarrhea for 1 week and then it will stop for a week. The patient does drink Lactaid milk but he has other dairy products like ice cream and cheese. The patient also reports that he takes his Nexium at certain times a day that is not the usual time he supposedly take it he starts to get diarrhea. He is no report of any black stools or bloody stools.   Current Outpatient Prescriptions  Medication Sig Dispense Refill  . bisoprolol (ZEBETA) 10 MG tablet Take by mouth. Reported on 02/23/2016    . esomeprazole (NEXIUM) 20 MG capsule Take by mouth daily.     . fluticasone (FLONASE) 50 MCG/ACT nasal spray Place 2 sprays into both nostrils daily as needed.     . Melatonin 10 MG TABS Take 10 mg by mouth at bedtime as needed.    . traZODone (DESYREL) 50 MG tablet TK 1 TO 2 TS PO QHS PRN INSOMNIA  1   No current facility-administered medications for this visit.     Allergies as of 06/14/2016 - Review Complete 06/14/2016  Allergen Reaction Noted  . Sulfa antibiotics Rash and Itching 10/23/2015  . Advil [ibuprofen]  12/25/2015  . Pantoprazole Diarrhea 11/24/2015  . Penicillins Rash 10/23/2015    ROS:  General: Negative for anorexia, weight loss, fever, chills, fatigue, weakness. ENT: Negative for hoarseness, difficulty swallowing , nasal congestion. CV: Negative for chest pain, angina, palpitations, dyspnea on exertion, peripheral edema.  Respiratory: Negative for dyspnea at rest,  dyspnea on exertion, cough, sputum, wheezing.  GI: See history of present illness. GU:  Negative for dysuria, hematuria, urinary incontinence, urinary frequency, nocturnal urination.  Endo: Negative for unusual weight change.    Physical Examination:   BP 128/83   Pulse (!) 54   Temp 98.8 F (37.1 C) (Oral)   Ht 6\' 2"  (1.88 m)   Wt 208 lb (94.3 kg)   BMI 26.71 kg/m   General: Well-nourished, well-developed in no acute distress.  Eyes: No icterus. Conjunctivae pink. Neuro: Alert and oriented x 3.  Grossly intact. Skin: Warm and dry, no jaundice.   Psych: Alert and cooperative, normal mood and affect.  Labs:    Imaging Studies: No results found.  Assessment and Plan:   Rodney Webb is a 60 y.o. y/o male who comes in with intermittent diarrhea. The patient had a colonoscopy by Dr. Bluford Kaufmannoh and biopsies did not show any cause for diarrhea. The patient has been told to watch the things he eats and try to see what may be causing his diarrhea. The patient has also been told to switch to Dexilant that he has been given samples due to the Nexium possibly being a cause of his diarrhea. The patient has been explained the plan and agrees with it.  Note: This dictation was prepared with Dragon dictation along with smaller phrase technology. Any transcriptional errors that result from this process are unintentional.

## 2016-07-01 ENCOUNTER — Telehealth: Payer: Self-pay | Admitting: Gastroenterology

## 2016-07-01 NOTE — Telephone Encounter (Signed)
Dexilant really isn't working. Should he continue to try it or what next.

## 2016-07-02 NOTE — Telephone Encounter (Signed)
Spoke with pt regarding the Dexilant. You switched him to this to see if his diarrhea improved. He said he can't tell a difference in how he feels. He is having diarrhea episodes about 3 days a week. Please advise.

## 2016-07-13 NOTE — Telephone Encounter (Signed)
Patient states that he has stopped his Nexium and Dexilant. Since stopping these medications, his diarrhea has stopped completely.

## 2016-07-20 NOTE — Telephone Encounter (Signed)
If the patient is doing well off the medication then keep off of it. If not then he can be put on zantac 150mg  bid.

## 2016-07-22 NOTE — Telephone Encounter (Signed)
Pt stated his diarrhea has stopped but he started having reflux again. He restarted the Nexium daily and so far he hasn't had the bad diarrhea. Just a little here and there. Advised him to continue the nexium and if diarrhea worsens, Dr. Servando SnareWohl has recommended him trying Zantac 150mg  BID.

## 2016-09-27 ENCOUNTER — Ambulatory Visit: Payer: BLUE CROSS/BLUE SHIELD

## 2016-10-27 ENCOUNTER — Encounter: Payer: Self-pay | Admitting: Urology

## 2016-10-27 ENCOUNTER — Ambulatory Visit: Payer: BLUE CROSS/BLUE SHIELD | Admitting: Urology

## 2016-10-27 VITALS — BP 154/97 | HR 65 | Ht 74.0 in | Wt 209.8 lb

## 2016-10-27 DIAGNOSIS — R350 Frequency of micturition: Secondary | ICD-10-CM

## 2016-10-27 LAB — MICROSCOPIC EXAMINATION: Bacteria, UA: NONE SEEN

## 2016-10-27 LAB — URINALYSIS, COMPLETE
Bilirubin, UA: NEGATIVE
Glucose, UA: NEGATIVE
Ketones, UA: NEGATIVE
Leukocytes, UA: NEGATIVE
Nitrite, UA: NEGATIVE
Protein, UA: NEGATIVE
Specific Gravity, UA: 1.02 (ref 1.005–1.030)
Urobilinogen, Ur: 0.2 mg/dL (ref 0.2–1.0)
pH, UA: 6.5 (ref 5.0–7.5)

## 2016-10-27 NOTE — Progress Notes (Signed)
10/27/2016 8:50 AM   Rodney Webb 03/08/1956 [id number]  Referring provider: Gilles Chiquito, MD [address] [place], [place] [address]  Chief Complaint  Patient presents with  . New Patient (Initial Visit)    frequent urination     HPI: I was consulted to assess the patient's frequency. In December he was voiding frequently especially at night. Since he changes his blood pressure medication his symptoms have improved greatly. He generally gets up once at night and rarely 3 times. He voids every 2 hours during the daytime with a reasonable to good flow. He does not take prostate medications  He denies a history kidney stones previous GU surgery urinary tract infections and he does not have neurologic issues  Modifying factors: There are no other modifying factors  Associated signs and symptoms: There are no other associated signs and symptoms Aggravating and relieving factors: There are no other aggravating or relieving factors Severity: Mild Duration: Persistent     PMH: Past Medical History:  Diagnosis Date  . GERD (gastroesophageal reflux disease)   . Hypertension   . Sleep apnea    uses CPAP    Surgical History: Past Surgical History:  Procedure Laterality Date  . COLONOSCOPY WITH PROPOFOL N/A 12/25/2015   Procedure: COLONOSCOPY WITH PROPOFOL;  Surgeon: Wallace Cullens, MD;  Location: [facility] ENDOSCOPY;  Service: Gastroenterology;  Laterality: N/A;  . ESOPHAGOGASTRODUODENOSCOPY (EGD) WITH PROPOFOL N/A 12/25/2015   Procedure: ESOPHAGOGASTRODUODENOSCOPY (EGD) WITH PROPOFOL;  Surgeon: Wallace Cullens, MD;  Location: [facility] ENDOSCOPY;  Service: Gastroenterology;  Laterality: N/A;  . TONSILLECTOMY      Home Medications:  Allergies as of 10/27/2016      Reactions   Sulfa Antibiotics Rash, Itching   Shortness of breath   Advil [ibuprofen]    Pantoprazole Diarrhea   Penicillins Rash      Medication List       Accurate as of 10/27/16  8:50 AM. Always use your most recent med  list.          bisoprolol 10 MG tablet Commonly known as:  ZEBETA Take by mouth. Reported on 02/23/2016   esomeprazole 20 MG capsule Commonly known as:  NEXIUM Take by mouth daily.   fexofenadine 180 MG tablet Commonly known as:  ALLEGRA Take 180 mg by mouth daily.   fluticasone 50 MCG/ACT nasal spray Commonly known as:  FLONASE Place 2 sprays into both nostrils daily as needed.   GLUCOSAMINE CHONDR 500 COMPLEX PO Take by mouth.   losartan 50 MG tablet Commonly known as:  COZAAR Take 50 mg by mouth daily.   Melatonin 10 MG Tabs Take 10 mg by mouth at bedtime as needed.   ranitidine 75 MG tablet Commonly known as:  ZANTAC Take 75 mg by mouth 2 (two) times daily.   traZODone 50 MG tablet Commonly known as:  DESYREL TK 1 TO 2 TS PO QHS PRN INSOMNIA       Allergies:  Allergies  Allergen Reactions  . Sulfa Antibiotics Rash and Itching    Shortness of breath  . Advil [Ibuprofen]   . Pantoprazole Diarrhea  . Penicillins Rash    Family History: Family History  Problem Relation Age of Onset  . Diabetes Mother   . Stroke Father     Social History:  reports that he has never smoked. He has quit using smokeless tobacco. He reports that he does not drink alcohol or use drugs.  ROS: UROLOGY Frequent Urination?: Yes Hard to postpone urination?:  No Burning/pain with urination?: No Get up at night to urinate?: Yes Leakage of urine?: No Urine stream starts and stops?: No Trouble starting stream?: No Do you have to strain to urinate?: No Blood in urine?: No Urinary tract infection?: No Sexually transmitted disease?: No Injury to kidneys or bladder?: No Painful intercourse?: No Weak stream?: Yes Erection problems?: No Penile pain?: No  Gastrointestinal Nausea?: No Vomiting?: Yes Indigestion/heartburn?: Yes Diarrhea?: No Constipation?: No  Constitutional Fever: No Night sweats?: No Weight loss?: Yes Fatigue?: Yes  Skin Skin rash/lesions?:  No Itching?: Yes  Eyes Blurred vision?: No Double vision?: No  Ears/Nose/Throat Sore throat?: No Sinus problems?: No  Hematologic/Lymphatic Swollen glands?: No Easy bruising?: No  Cardiovascular Leg swelling?: No Chest pain?: No  Respiratory Cough?: No Shortness of breath?: No  Endocrine Excessive thirst?: No  Musculoskeletal Back pain?: Yes Joint pain?: Yes  Neurological Headaches?: No Dizziness?: No  Psychologic Depression?: No Anxiety?: No  Physical Exam: BP (!) 154/97   Pulse 65   Ht 6\' 2"  (1.88 m)   Wt 95.2 kg (209 lb 12.8 oz)   BMI 26.94 kg/m   Constitutional:  Alert and oriented, No acute distress. HEENT: [place] AT, moist mucus membranes.  Trachea midline, no masses. Cardiovascular: No clubbing, cyanosis, or edema. Respiratory: Normal respiratory effort, no increased work of breathing. GI: Abdomen is soft, nontender, nondistended, no abdominal masses GU: No CVA tenderness. Benign 40 g prostate Skin: No rashes, bruises or suspicious lesions. Lymph: No cervical or inguinal adenopathy. Neurologic: Grossly intact, no focal deficits, moving all 4 extremities. Psychiatric: Normal mood and affect.  Laboratory Data: Lab Results  Component Value Date   WBC 5.8 03/22/2016   HGB 15.7 03/22/2016   HCT 44.8 03/22/2016   MCV 91.9 03/22/2016   PLT 200 03/22/2016    No results found for: CREATININE  No results found for: PSA  No results found for: TESTOSTERONE  No results found for: HGBA1C  Urinalysis No results found for: COLORURINE, APPEARANCEUR, LABSPEC, PHURINE, GLUCOSEU, HGBUR, BILIRUBINUR, KETONESUR, PROTEINUR, UROBILINOGEN, NITRITE, LEUKOCYTESUR  Pertinent Imaging:  None  Assessment & Plan:  The patient has mild frequency and nocturia and a little bit of urgency. Watchful waiting was recommended. The patient was found to have microscopic hematuria and by history he had the same in the primary care office about 6 months ago. I went through  differential diagnoses and workup. I think he is willing to have the test but currently he has medical bills and wants to put things off for a while. He understood the differential in the workup well. He may call in a few months and we can organize a CT scan over the phone. He will then need a cystoscopy. He does not take daily aspirin or blood thinners and he has never smoked    I will see him on a when necessary basis  1. Frequent urination 2. Nighttime frequency - Urinalysis, Complete - Bladder Scan (Post Void Residual) in office   No Follow-up on file.  Martina SinnerMACDIARMID,Yenifer Saccente A, MD  [facility] [address], [address] [place], [place] [address] [phone]

## 2017-05-26 ENCOUNTER — Ambulatory Visit: Payer: BLUE CROSS/BLUE SHIELD | Admitting: Gastroenterology

## 2018-09-20 ENCOUNTER — Other Ambulatory Visit: Payer: Self-pay

## 2018-09-20 ENCOUNTER — Ambulatory Visit: Payer: BLUE CROSS/BLUE SHIELD | Admitting: Gastroenterology

## 2018-09-20 ENCOUNTER — Encounter: Payer: Self-pay | Admitting: Gastroenterology

## 2018-09-20 ENCOUNTER — Encounter (INDEPENDENT_AMBULATORY_CARE_PROVIDER_SITE_OTHER): Payer: Self-pay

## 2018-09-20 VITALS — BP 166/89 | HR 73 | Ht 74.0 in | Wt 217.2 lb

## 2018-09-20 DIAGNOSIS — K222 Esophageal obstruction: Secondary | ICD-10-CM

## 2018-09-20 DIAGNOSIS — K228 Other specified diseases of esophagus: Secondary | ICD-10-CM

## 2018-09-20 DIAGNOSIS — R1013 Epigastric pain: Secondary | ICD-10-CM | POA: Diagnosis not present

## 2018-09-20 DIAGNOSIS — K2281 Esophageal polyp: Secondary | ICD-10-CM

## 2018-09-20 DIAGNOSIS — G8929 Other chronic pain: Secondary | ICD-10-CM

## 2018-09-20 NOTE — Progress Notes (Signed)
Primary Care Physician: Gilles Chiquito, MD  Primary Gastroenterologist:  Dr. Midge Minium  Chief Complaint  Patient presents with  . Abdominal Pain    HPI: Rodney Webb is a 63 y.o. male here for evaluation prior to an EGD.  The patient reports that he had a colonoscopy and EGD by Dr. Bluford Kaufmann in 2017 due to abdominal pain.  The patient's abdominal pain at that time was in the left lower quadrant and had resolved on its own.  The patient did have a CT scan that was suggestive of cirrhosis therefore he had seen me with a liver biopsy not showing any signs of cirrhosis.  The patient's most recent liver enzymes in October showed his liver enzymes to be normal.  The patient had another episode of abdominal pain recently after being fine for the last few years.  He states that he was walking towards his truck and the pain was in the mid abdomen and doubled him over.  He had not been eating drinking or reporting any change in bowel habits. During the patient's last EGD in 2017 he was found to have a nodule in the esophagus and it was recommended that he have a repeat EGD in 1 year so that the nodule could be reevaluated.  The biopsies at that time showed it to be inflammatory.  It was also recommended that if the polyp was still there that he should undergo an EUS for further evaluation.  Current Outpatient Medications  Medication Sig Dispense Refill  . esomeprazole (NEXIUM) 20 MG capsule Take by mouth daily.     . fluticasone (FLONASE) 50 MCG/ACT nasal spray Place 2 sprays into both nostrils daily as needed.     . Glucosamine-Chondroit-Vit C-Mn (GLUCOSAMINE CHONDR 500 COMPLEX PO) Take by mouth.    . losartan (COZAAR) 50 MG tablet Take 50 mg by mouth daily.    . bisoprolol (ZEBETA) 10 MG tablet Take by mouth. Reported on 02/23/2016    . fexofenadine (ALLEGRA) 180 MG tablet Take 180 mg by mouth daily.    . Melatonin 10 MG TABS Take 10 mg by mouth at bedtime as needed.    . ranitidine (ZANTAC)  75 MG tablet Take 75 mg by mouth 2 (two) times daily.    . traZODone (DESYREL) 50 MG tablet TK 1 TO 2 TS PO QHS PRN INSOMNIA  1   No current facility-administered medications for this visit.     Allergies as of 09/20/2018 - Review Complete 09/20/2018  Allergen Reaction Noted  . Sulfa antibiotics Rash and Itching 10/23/2015  . Advil [ibuprofen]  12/25/2015  . Pantoprazole Diarrhea 11/24/2015  . Penicillins Rash 10/23/2015    ROS:  General: Negative for anorexia, weight loss, fever, chills, fatigue, weakness. ENT: Negative for hoarseness, difficulty swallowing , nasal congestion. CV: Negative for chest pain, angina, palpitations, dyspnea on exertion, peripheral edema.  Respiratory: Negative for dyspnea at rest, dyspnea on exertion, cough, sputum, wheezing.  GI: See history of present illness. GU:  Negative for dysuria, hematuria, urinary incontinence, urinary frequency, nocturnal urination.  Endo: Negative for unusual weight change.    Physical Examination:   BP (!) 166/89   Pulse 73   Ht 6\' 2"  (1.88 m)   Wt 217 lb 3.2 oz (98.5 kg)   BMI 27.89 kg/m   General: Well-nourished, well-developed in no acute distress.  Eyes: No icterus. Conjunctivae pink. Mouth: Oropharyngeal mucosa moist and pink , no lesions erythema or exudate. Lungs: Clear to auscultation bilaterally.  Non-labored. Heart: Regular rate and rhythm, no murmurs rubs or gallops.  Abdomen: Bowel sounds are normal, nontender, nondistended, no hepatosplenomegaly or masses, no abdominal bruits or hernia , no rebound or guarding.   Extremities: No lower extremity edema. No clubbing or deformities. Neuro: Alert and oriented x 3.  Grossly intact. Skin: Warm and dry, no jaundice.   Psych: Alert and cooperative, normal mood and affect.  Labs:    Imaging Studies: No results found.  Assessment and Plan:   Rodney Webb is a 63 y.o. y/o male who has a history of abdominal pain that was quiescent for the last few years  but he had one attack not related to any GI function such as eating drinking or moving his bowels.  Nothing made the symptoms any better or worse but went away by themselves after a few minutes.  This pain sounds mostly consistent with a possible muscle spasm.  The patient had a negative CT scan as a work-up for this in the past.  He has been told to try and ascertain whether it is musculoskeletal if he gets the symptoms again.  He is also been told that he will need a repeat EGD to look at the GE junction for that nodule/polyp that was seen previously and if it is still present he may need an endoscopic ultrasound to further evaluate the lesion.  The patient has been explained the plan and agrees with it.    Midge Minium, MD. Clementeen Graham   Note: This dictation was prepared with Dragon dictation along with smaller phrase technology. Any transcriptional errors that result from this process are unintentional.

## 2018-09-21 ENCOUNTER — Telehealth: Payer: Self-pay | Admitting: Gastroenterology

## 2018-09-21 NOTE — Telephone Encounter (Signed)
Reschedule EGD from 10/09/18 to 10/06/18.

## 2018-09-21 NOTE — Telephone Encounter (Signed)
Pt left vm to r/s his procedure 10/09/18

## 2018-09-28 ENCOUNTER — Encounter: Payer: Self-pay | Admitting: *Deleted

## 2018-09-28 ENCOUNTER — Other Ambulatory Visit: Payer: Self-pay

## 2018-10-04 NOTE — Anesthesia Preprocedure Evaluation (Addendum)
Anesthesia Evaluation  Patient identified by MRN, date of birth, ID band Patient awake    Reviewed: Allergy & Precautions, NPO status , Patient's Chart, lab work & pertinent test results  History of Anesthesia Complications Negative for: history of anesthetic complications  Airway Mallampati: I   Neck ROM: Full    Dental no notable dental hx.    Pulmonary sleep apnea ,    Pulmonary exam normal breath sounds clear to auscultation       Cardiovascular Exercise Tolerance: Good hypertension, Normal cardiovascular exam Rhythm:Regular Rate:Normal     Neuro/Psych Vertigo; HOH    GI/Hepatic GERD  ,  Endo/Other  negative endocrine ROS  Renal/GU negative Renal ROS     Musculoskeletal  (+) Arthritis ,   Abdominal   Peds  Hematology negative hematology ROS (+)   Anesthesia Other Findings   Reproductive/Obstetrics                           Anesthesia Physical Anesthesia Plan  ASA: II  Anesthesia Plan: General   Post-op Pain Management:    Induction: Intravenous  PONV Risk Score and Plan: 2 and Propofol infusion and TIVA  Airway Management Planned: Natural Airway  Additional Equipment:   Intra-op Plan:   Post-operative Plan:   Informed Consent: I have reviewed the patients History and Physical, chart, labs and discussed the procedure including the risks, benefits and alternatives for the proposed anesthesia with the patient or authorized representative who has indicated his/her understanding and acceptance.       Plan Discussed with: CRNA  Anesthesia Plan Comments:        Anesthesia Quick Evaluation

## 2018-10-06 ENCOUNTER — Ambulatory Visit: Payer: BLUE CROSS/BLUE SHIELD | Admitting: Anesthesiology

## 2018-10-06 ENCOUNTER — Ambulatory Visit
Admission: RE | Admit: 2018-10-06 | Discharge: 2018-10-06 | Disposition: A | Discharge status: Home / Self Care | Payer: BLUE CROSS/BLUE SHIELD | Attending: Gastroenterology | Admitting: Gastroenterology

## 2018-10-06 ENCOUNTER — Encounter
Admission: RE | Disposition: A | Discharge status: Home / Self Care | Payer: Self-pay | Source: Home / Self Care | Attending: Gastroenterology

## 2018-10-06 DIAGNOSIS — H9193 Unspecified hearing loss, bilateral: Secondary | ICD-10-CM | POA: Diagnosis not present

## 2018-10-06 DIAGNOSIS — Z9989 Dependence on other enabling machines and devices: Secondary | ICD-10-CM | POA: Diagnosis not present

## 2018-10-06 DIAGNOSIS — K449 Diaphragmatic hernia without obstruction or gangrene: Secondary | ICD-10-CM | POA: Diagnosis not present

## 2018-10-06 DIAGNOSIS — K219 Gastro-esophageal reflux disease without esophagitis: Secondary | ICD-10-CM | POA: Diagnosis not present

## 2018-10-06 DIAGNOSIS — R1013 Epigastric pain: Secondary | ICD-10-CM | POA: Diagnosis not present

## 2018-10-06 DIAGNOSIS — K3189 Other diseases of stomach and duodenum: Secondary | ICD-10-CM

## 2018-10-06 DIAGNOSIS — Z79899 Other long term (current) drug therapy: Secondary | ICD-10-CM | POA: Diagnosis not present

## 2018-10-06 DIAGNOSIS — K317 Polyp of stomach and duodenum: Secondary | ICD-10-CM

## 2018-10-06 DIAGNOSIS — G4730 Sleep apnea, unspecified: Secondary | ICD-10-CM | POA: Insufficient documentation

## 2018-10-06 DIAGNOSIS — K222 Esophageal obstruction: Secondary | ICD-10-CM

## 2018-10-06 DIAGNOSIS — I10 Essential (primary) hypertension: Secondary | ICD-10-CM | POA: Insufficient documentation

## 2018-10-06 HISTORY — PX: POLYPECTOMY: SHX5525

## 2018-10-06 HISTORY — PX: ESOPHAGOGASTRODUODENOSCOPY (EGD) WITH PROPOFOL: SHX5813

## 2018-10-06 HISTORY — DX: Unspecified osteoarthritis, unspecified site: M19.90

## 2018-10-06 HISTORY — DX: Unspecified hearing loss, unspecified ear: H91.90

## 2018-10-06 HISTORY — DX: Dizziness and giddiness: R42

## 2018-10-06 SURGERY — ESOPHAGOGASTRODUODENOSCOPY (EGD) WITH PROPOFOL
Anesthesia: General

## 2018-10-06 MED ORDER — LACTATED RINGERS IV SOLN
INTRAVENOUS | Status: DC
  Administered 2018-10-06: 08:00:00 via INTRAVENOUS

## 2018-10-06 MED ORDER — ONDANSETRON HCL 4 MG/2ML IJ SOLN: 4.0000 mg | Freq: Once | INTRAMUSCULAR | Status: DC | PRN

## 2018-10-06 MED ORDER — ACETAMINOPHEN 160 MG/5ML PO SOLN: 325.0000 mg | ORAL | Status: DC | PRN

## 2018-10-06 MED ORDER — SODIUM CHLORIDE 0.9 % IV SOLN: INTRAVENOUS | Status: DC

## 2018-10-06 MED ORDER — GLYCOPYRROLATE 0.2 MG/ML IJ SOLN
INTRAMUSCULAR | Status: DC | PRN
  Administered 2018-10-06: 0.1 mg via INTRAVENOUS

## 2018-10-06 MED ORDER — LIDOCAINE HCL (CARDIAC) PF 100 MG/5ML IV SOSY
PREFILLED_SYRINGE | INTRAVENOUS | Status: DC | PRN
  Administered 2018-10-06: 30 mg via INTRAVENOUS

## 2018-10-06 MED ORDER — PROPOFOL 10 MG/ML IV BOLUS
INTRAVENOUS | Status: DC | PRN
  Administered 2018-10-06: 30 mg via INTRAVENOUS
  Administered 2018-10-06: 100 mg via INTRAVENOUS
  Administered 2018-10-06: 50 mg via INTRAVENOUS

## 2018-10-06 MED ORDER — ACETAMINOPHEN 325 MG PO TABS: 650.0000 mg | ORAL_TABLET | Freq: Once | ORAL | Status: DC | PRN

## 2018-10-06 SURGICAL SUPPLY — 6 items
BLOCK BITE 60FR ADLT L/F GRN (MISCELLANEOUS) ×4 IMPLANT
CANISTER SUCT 1200ML W/VALVE (MISCELLANEOUS) ×4 IMPLANT
FORCEPS BIOP RAD 4 LRG CAP 4 (CUTTING FORCEPS) ×2 IMPLANT
GOWN CVR UNV OPN BCK APRN NK (MISCELLANEOUS) ×4 IMPLANT
GOWN ISOL THUMB LOOP REG UNIV (MISCELLANEOUS) ×8
WATER STERILE IRR 250ML POUR (IV SOLUTION) ×4 IMPLANT

## 2018-10-06 NOTE — Anesthesia Procedure Notes (Signed)
Date/Time: 10/06/2018 9:00 AM Performed by: Maree Krabbe, CRNA Pre-anesthesia Checklist: Patient identified, Emergency Drugs available, Suction available, Timeout performed and Patient being monitored Patient Re-evaluated:Patient Re-evaluated prior to induction Oxygen Delivery Method: Nasal cannula Placement Confirmation: positive ETCO2

## 2018-10-06 NOTE — Discharge Instructions (Signed)
General Anesthesia, Adult, Care After  This sheet gives you information about how to care for yourself after your procedure. Your health care provider may also give you more specific instructions. If you have problems or questions, contact your health care provider.  What can I expect after the procedure?  After the procedure, the following side effects are common:  Pain or discomfort at the IV site.  Nausea.  Vomiting.  Sore throat.  Trouble concentrating.  Feeling cold or chills.  Weak or tired.  Sleepiness and fatigue.  Soreness and body aches. These side effects can affect parts of the body that were not involved in surgery.  Follow these instructions at home:    For at least 24 hours after the procedure:  Have a responsible adult stay with you. It is important to have someone help care for you until you are awake and alert.  Rest as needed.  Do not:  Participate in activities in which you could fall or become injured.  Drive.  Use heavy machinery.  Drink alcohol.  Take sleeping pills or medicines that cause drowsiness.  Make important decisions or sign legal documents.  Take care of children on your own.  Eating and drinking  Follow any instructions from your health care provider about eating or drinking restrictions.  When you feel hungry, start by eating small amounts of foods that are soft and easy to digest (bland), such as toast. Gradually return to your regular diet.  Drink enough fluid to keep your urine pale yellow.  If you vomit, rehydrate by drinking water, juice, or clear broth.  General instructions  If you have sleep apnea, surgery and certain medicines can increase your risk for breathing problems. Follow instructions from your health care provider about wearing your sleep device:  Anytime you are sleeping, including during daytime naps.  While taking prescription pain medicines, sleeping medicines, or medicines that make you drowsy.  Return to your normal activities as told by your health care  provider. Ask your health care provider what activities are safe for you.  Take over-the-counter and prescription medicines only as told by your health care provider.  If you smoke, do not smoke without supervision.  Keep all follow-up visits as told by your health care provider. This is important.  Contact a health care provider if:  You have nausea or vomiting that does not get better with medicine.  You cannot eat or drink without vomiting.  You have pain that does not get better with medicine.  You are unable to pass urine.  You develop a skin rash.  You have a fever.  You have redness around your IV site that gets worse.  Get help right away if:  You have difficulty breathing.  You have chest pain.  You have blood in your urine or stool, or you vomit blood.  Summary  After the procedure, it is common to have a sore throat or nausea. It is also common to feel tired.  Have a responsible adult stay with you for the first 24 hours after general anesthesia. It is important to have someone help care for you until you are awake and alert.  When you feel hungry, start by eating small amounts of foods that are soft and easy to digest (bland), such as toast. Gradually return to your regular diet.  Drink enough fluid to keep your urine pale yellow.  Return to your normal activities as told by your health care provider. Ask your health care   provider what activities are safe for you.  This information is not intended to replace advice given to you by your health care provider. Make sure you discuss any questions you have with your health care provider.  Document Released: 12/06/2000 Document Revised: 04/15/2017 Document Reviewed: 04/15/2017  Elsevier Interactive Patient Education  2019 Elsevier Inc.

## 2018-10-06 NOTE — Op Note (Signed)
[facility] Gastroenterology Patient Name: Rodney Webb Procedure Date: 10/06/2018 8:56 AM MRN: [id number] Account #: [id number] Date of Birth: 02/05/1956 Admit Type: Outpatient Age: 63 [facility] Gender: Male Note Status: Finalized Procedure:            Upper GI endoscopy Indications:          Epigastric abdominal pain Providers:            Midge Miniumarren Britainy Kozub MD, MD Referring MD:         Gilles ChiquitoJoseph H. Rabinowitz (Referring MD) Medicines:            Propofol per Anesthesia Complications:        No immediate complications. Procedure:            Pre-Anesthesia Assessment:                       - Prior to the procedure, a History and Physical was                        performed, and patient medications and allergies were                        reviewed. The patient's tolerance of previous                        anesthesia was also reviewed. The risks and benefits of                        the procedure and the sedation options and risks were                        discussed with the patient. All questions were                        answered, and informed consent was obtained. Prior                        Anticoagulants: The patient has taken no previous                        anticoagulant or antiplatelet agents. ASA Grade                        Assessment: II - A patient with mild systemic disease.                        After reviewing the risks and benefits, the patient was                        deemed in satisfactory condition to undergo the                        procedure.                       After obtaining informed consent, the endoscope was                        passed under direct vision. Throughout the procedure,  the patient's blood pressure, pulse, and oxygen                        saturations were monitored continuously. The was                        introduced through the mouth, and advanced to the   second part of duodenum. The upper GI endoscopy was                        accomplished without difficulty. The patient tolerated                        the procedure well. Findings:      A small hiatal hernia was present.      One 6 mm polyp with no bleeding was found at the gastroesophageal       junction. Biopsies were taken with a cold forceps for histology.      A single 8 mm submucosal papule (nodule) with no bleeding and no       stigmata of recent bleeding was found in the gastric antrum.      The examined duodenum was normal. Impression:           - Small hiatal hernia.                       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                        Biopsied.                       - A single submucosal papule (nodule) found in the                        stomach.                       - Normal examined duodenum. Recommendation:       - Discharge patient to home.                       - Resume previous diet.                       - Continue present medications.                       - Await pathology results.                       - Perform an upper endoscopic ultrasound (UEUS) at                        appointment to be scheduled. Procedure Code(s):    --- Professional ---                       [phone], Esophagogastroduodenoscopy, flexible, transoral;                        with biopsy, single or multiple Diagnosis Code(s):    --- Professional ---  R10.13, Epigastric pain                       K31.89, Other diseases of stomach and duodenum                       K31.7, Polyp of stomach and duodenum CPT copyright 2018 American Medical Association. All rights reserved. The codes documented in this report are preliminary and upon coder review may  be revised to meet current compliance requirements. Midge Miniumarren Antavius Sperbeck MD, MD 10/06/2018 9:11:35 AM This report has been signed electronically. Number of Addenda: 0 Note Initiated On: 10/06/2018 8:56 AM       [facility]

## 2018-10-06 NOTE — Anesthesia Postprocedure Evaluation (Signed)
Anesthesia Post Note  Patient: Rodney Webb  Procedure(s) Performed: ESOPHAGOGASTRODUODENOSCOPY (EGD) WITH PROPOFOL (N/A ) POLYPECTOMY  Patient location during evaluation: PACU Anesthesia Type: General Level of consciousness: awake and alert, oriented and patient cooperative Pain management: pain level controlled Vital Signs Assessment: post-procedure vital signs reviewed and stable Respiratory status: spontaneous breathing, nonlabored ventilation and respiratory function stable Cardiovascular status: blood pressure returned to baseline and stable Postop Assessment: adequate PO intake Anesthetic complications: no    Reed Breech

## 2018-10-06 NOTE — H&P (Signed)
Midge Minium, MD [facility] [address], [address] [place], [place] [address] Phone:[phone] Fax : [phone]  Primary Care Physician:  Gilles Chiquito, MD Primary Gastroenterologist:  Dr. Servando Snare  Pre-Procedure History & Physical: HPI:  Rodney Webb is a 63 y.o. male is here for an endoscopy.   Past Medical History:  Diagnosis Date  . Arthritis    wrists  . GERD (gastroesophageal reflux disease)   . Hard of hearing    both ears  . Hypertension   . Sleep apnea    uses CPAP  . Vertigo    no episodes for 3-4 months    Past Surgical History:  Procedure Laterality Date  . COLONOSCOPY WITH PROPOFOL N/A 12/25/2015   Procedure: COLONOSCOPY WITH PROPOFOL;  Surgeon: Wallace Cullens, MD;  Location[facility] ENDOSCOPY;  Service: Gastroenterology;  Laterality: N/A;  . ESOPHAGOGASTRODUODENOSCOPY (EGD) WITH PROPOFOL N/A 12/25/2015   Procedure: ESOPHAGOGASTRODUODENOSCOPY (EGD) WITH PROPOFOL;  Surgeon: Wallace Cullens, MD;  Location: [facility] ENDOSCOPY;  Service: Gastroenterology;  Laterality: N/A;  . TONSILLECTOMY      Prior to Admission medications   Medication Sig Start Date End Date Taking? Authorizing Provider  cetirizine (ZYRTEC) 10 MG tablet Take 10 mg by mouth daily.   Yes [provider]  ELDERBERRY PO Take by mouth.   Yes [provider]  esomeprazole (NEXIUM) 20 MG capsule Take by mouth daily.  10/17/15  Yes [provider]  fluticasone (FLONASE) 50 MCG/ACT nasal spray Place 2 sprays into both nostrils daily as needed.    Yes [provider]  Ginkgo Biloba (GNP GINGKO BILOBA EXTRACT PO) Take by mouth.   Yes [provider]  Glucosamine-Chondroit-Vit C-Mn (GLUCOSAMINE CHONDR 500 COMPLEX PO) Take by mouth.   Yes [provider]  losartan (COZAAR) 50 MG tablet Take 50 mg by mouth daily.   Yes [provider]  Saw Palmetto, Serenoa repens, (SAW PALMETTO PO) Take by mouth.   Yes [provider]  TURMERIC PO Take by mouth.    Yes [provider]    Allergies as of 09/20/2018 - Review Complete 09/20/2018  Allergen Reaction Noted  . Sulfa antibiotics Rash and Itching 10/23/2015  . Advil [ibuprofen]  12/25/2015  . Pantoprazole Diarrhea 11/24/2015  . Penicillins Rash 10/23/2015    Family History  Problem Relation Age of Onset  . Diabetes Mother   . Stroke Father     Social History   Socioeconomic History  . Marital status: Married    Spouse name: Not on file  . Number of children: Not on file  . Years of education: Not on file  . Highest education level: Not on file  Occupational History  . Not on file  Social Needs  . Financial resource strain: Not on file  . Food insecurity:    Worry: Not on file    Inability: Not on file  . Transportation needs:    Medical: Not on file    Non-medical: Not on file  Tobacco Use  . Smoking status: Never Smoker  . Smokeless tobacco: Former Engineer, water and Sexual Activity  . Alcohol use: No    Alcohol/week: 0.0 standard drinks  . Drug use: No  . Sexual activity: Not on file  Lifestyle  . Physical activity:    Days per week: Not on file    Minutes per session: Not on file  . Stress: Not on file  Relationships  . Social connections:    Talks on phone: Not  on file    Gets together: Not on file    Attends religious service: Not on file    Active member of club or organization: Not on file    Attends meetings of clubs or organizations: Not on file    Relationship status: Not on file  . Intimate partner violence:    Fear of current or ex partner: Not on file    Emotionally abused: Not on file    Physically abused: Not on file    Forced sexual activity: Not on file  Other Topics Concern  . Not on file  Social History Narrative  . Not on file    Review of Systems: See HPI, otherwise negative ROS  Physical Exam: BP (!) 152/93   Pulse 69   Temp 97.9 F (36.6 C)   Resp 15   Ht 6\' 2"  (1.88 m)   Wt 96.6 kg   SpO2 99%   BMI 27.35  kg/m  General:   Alert,  pleasant and cooperative in NAD Head:  Normocephalic and atraumatic. Neck:  Supple; no masses or thyromegaly. Lungs:  Clear throughout to auscultation.    Heart:  Regular rate and rhythm. Abdomen:  Soft, nontender and nondistended. Normal bowel sounds, without guarding, and without rebound.   Neurologic:  Alert and  oriented x4;  grossly normal neurologically.  Impression/Plan: Rodney Webb is here for an endoscopy to be performed for esophageal polyp 12/2015  Risks, benefits, limitations, and alternatives regarding  endoscopy have been reviewed with the patient.  Questions have been answered.  All parties agreeable.   Midge Minium, MD  10/06/2018, 7:52 AM

## 2018-10-06 NOTE — Transfer of Care (Signed)
Immediate Anesthesia Transfer of Care Note  Patient: Rodney Webb  Procedure(s) Performed: ESOPHAGOGASTRODUODENOSCOPY (EGD) WITH PROPOFOL (N/A )  Patient Location: PACU  Anesthesia Type: General  Level of Consciousness: awake, alert  and patient cooperative  Airway and Oxygen Therapy: Patient Spontanous Breathing and Patient connected to supplemental oxygen  Post-op Assessment: Post-op Vital signs reviewed, Patient's Cardiovascular Status Stable, Respiratory Function Stable, Patent Airway and No signs of Nausea or vomiting  Post-op Vital Signs: Reviewed and stable  Complications: No apparent anesthesia complications

## 2018-10-09 ENCOUNTER — Encounter: Payer: Self-pay | Admitting: Gastroenterology

## 2018-10-10 ENCOUNTER — Encounter: Payer: Self-pay | Admitting: Gastroenterology

## 2018-10-17 ENCOUNTER — Telehealth: Payer: Self-pay

## 2018-10-17 ENCOUNTER — Telehealth: Payer: Self-pay | Admitting: Gastroenterology

## 2018-10-17 NOTE — Telephone Encounter (Signed)
Pt left vm he states he spoke with Ginger about test and one test was clear but he was supposed to have another test done and [facility] him b ut he has not heard anything regarding this and would like to speak with Ginger

## 2018-10-17 NOTE — Telephone Encounter (Signed)
Patient's wife called & would like more clarification on what's going on with the patient. Please call

## 2018-10-17 NOTE — Telephone Encounter (Signed)
I have called and spoken to Rodney Webb regarding EUS for polyp removal. They physicians that come here from [facility] to perform EUS do not remove things during the procedure. We can have this arranged in [place] with a 1-2 month wait or at [facility] with a one week wait. He would prefer to get it done quickly. Referral has been sent to [facility] and they will contact him for scheduling and instructions.

## 2018-10-17 NOTE — Telephone Encounter (Signed)
-----   Message from Midge Minium, MD sent at 10/10/2018 11:59 AM EST ----- Let the patient know the esophagus biopsy was benign but he needs an EUS for the gastric nodule. Please set this up.

## 2018-10-19 NOTE — Telephone Encounter (Signed)
Spoke with pt's wife and discussed the EUS procedure. She was frustrated because her husband not explained what was happening and she thought he was needing the gastric polyp surgically removed.

## 2019-02-08 ENCOUNTER — Other Ambulatory Visit: Payer: Self-pay | Admitting: Internal Medicine

## 2019-02-08 ENCOUNTER — Ambulatory Visit
Admission: RE | Admit: 2019-02-08 | Discharge: 2019-02-08 | Disposition: A | Discharge status: Home / Self Care | Payer: BLUE CROSS/BLUE SHIELD | Source: Ambulatory Visit | Attending: Internal Medicine | Admitting: Internal Medicine

## 2019-02-08 DIAGNOSIS — R52 Pain, unspecified: Secondary | ICD-10-CM | POA: Diagnosis present

## 2019-02-19 ENCOUNTER — Encounter: Payer: Self-pay | Admitting: Emergency Medicine

## 2019-02-19 ENCOUNTER — Emergency Department
Admission: EM | Admit: 2019-02-19 | Discharge: 2019-02-19 | Disposition: A | Discharge status: Home / Self Care | Payer: BLUE CROSS/BLUE SHIELD | Attending: Emergency Medicine | Admitting: Emergency Medicine

## 2019-02-19 ENCOUNTER — Emergency Department: Payer: BLUE CROSS/BLUE SHIELD

## 2019-02-19 DIAGNOSIS — R109 Unspecified abdominal pain: Secondary | ICD-10-CM | POA: Diagnosis present

## 2019-02-19 DIAGNOSIS — R110 Nausea: Secondary | ICD-10-CM | POA: Insufficient documentation

## 2019-02-19 DIAGNOSIS — R350 Frequency of micturition: Secondary | ICD-10-CM | POA: Insufficient documentation

## 2019-02-19 DIAGNOSIS — N200 Calculus of kidney: Secondary | ICD-10-CM | POA: Diagnosis not present

## 2019-02-19 DIAGNOSIS — I10 Essential (primary) hypertension: Secondary | ICD-10-CM | POA: Diagnosis not present

## 2019-02-19 LAB — CBC
HCT: 44.4 % (ref 39.0–52.0)
Hemoglobin: 15.5 g/dL (ref 13.0–17.0)
MCH: 32 pg (ref 26.0–34.0)
MCHC: 34.9 g/dL (ref 30.0–36.0)
MCV: 91.5 fL (ref 80.0–100.0)
Platelets: 286 10*3/uL (ref 150–400)
RBC: 4.85 MIL/uL (ref 4.22–5.81)
RDW: 11.9 % (ref 11.5–15.5)
WBC: 8.4 10*3/uL (ref 4.0–10.5)
nRBC: 0 % (ref 0.0–0.2)

## 2019-02-19 LAB — BASIC METABOLIC PANEL
Anion gap: 10 (ref 5–15)
BUN: 23 mg/dL (ref 8–23)
CO2: 24 mmol/L (ref 22–32)
Calcium: 9.1 mg/dL (ref 8.9–10.3)
Chloride: 103 mmol/L (ref 98–111)
Creatinine, Ser: 1.12 mg/dL (ref 0.61–1.24)
GFR calc Af Amer: >60 mL/min (ref >60)
GFR calc non Af Amer: >60 mL/min (ref >60)
Glucose, Bld: 139 mg/dL — ABNORMAL HIGH (ref 70–99)
Potassium: 3.3 mmol/L — ABNORMAL LOW (ref 3.5–5.1)
Sodium: 137 mmol/L (ref 135–145)

## 2019-02-19 LAB — URINALYSIS, COMPLETE (UACMP) WITH MICROSCOPIC
Bacteria, UA: NONE SEEN
Bilirubin Urine: NEGATIVE
Glucose, UA: NEGATIVE mg/dL
Ketones, ur: NEGATIVE mg/dL
Leukocytes,Ua: NEGATIVE
Nitrite: NEGATIVE
Protein, ur: NEGATIVE mg/dL
Specific Gravity, Urine: 1.018 (ref 1.005–1.030)
Squamous Epithelial / HPF: NONE SEEN (ref 0–5)
pH: 5 (ref 5.0–8.0)

## 2019-02-19 MED ORDER — MORPHINE SULFATE (PF) 4 MG/ML IV SOLN
6.0000 mg | Freq: Once | INTRAVENOUS | Status: AC
  Administered 2019-02-19: 6 mg via INTRAVENOUS
  Filled 2019-02-19: qty 2

## 2019-02-19 MED ORDER — KETOROLAC TROMETHAMINE 15 MG/ML IJ SOLN
15.0000 mg | Freq: Once | INTRAMUSCULAR | Status: DC
  Filled 2019-02-19: qty 1

## 2019-02-19 MED ORDER — OXYCODONE-ACETAMINOPHEN 5-325 MG PO TABS: 1.0000 | ORAL_TABLET | Freq: Four times a day (QID) | ORAL | 0 refills | Status: DC | PRN

## 2019-02-19 MED ORDER — OXYCODONE-ACETAMINOPHEN 5-325 MG PO TABS
1.0000 | ORAL_TABLET | Freq: Once | ORAL | Status: AC
  Administered 2019-02-19: 1 via ORAL
  Filled 2019-02-19: qty 1

## 2019-02-19 MED ORDER — KETOROLAC TROMETHAMINE 30 MG/ML IJ SOLN
15.0000 mg | Freq: Once | INTRAMUSCULAR | Status: AC
  Administered 2019-02-19: 15 mg via INTRAVENOUS

## 2019-02-19 MED ORDER — ONDANSETRON HCL 4 MG/2ML IJ SOLN
4.0000 mg | Freq: Once | INTRAMUSCULAR | Status: AC
  Administered 2019-02-19: 4 mg via INTRAVENOUS
  Filled 2019-02-19: qty 2

## 2019-02-19 MED ORDER — SODIUM CHLORIDE 0.9 % IV BOLUS
1000.0000 mL | Freq: Once | INTRAVENOUS | Status: AC
  Administered 2019-02-19: 1000 mL via INTRAVENOUS

## 2019-02-19 MED ORDER — ONDANSETRON 4 MG PO TBDP: 4.0000 mg | ORAL_TABLET | Freq: Three times a day (TID) | ORAL | 0 refills | Status: DC | PRN

## 2019-02-19 MED ORDER — NAPROXEN 500 MG PO TABS: 500.0000 mg | ORAL_TABLET | Freq: Two times a day (BID) | ORAL | 0 refills | Status: DC | PRN

## 2019-02-19 NOTE — Discharge Instructions (Signed)
Drink plenty of fluids  Take the medications as prescribed  If pain persists after several days, call to set up an appointment with a Urologist  Avoid tea and soda

## 2019-02-19 NOTE — ED Notes (Signed)
Pt used urinal; about 500cc.

## 2019-02-19 NOTE — ED Provider Notes (Signed)
[facility] Emergency Department Provider Note  ____________________________________________   First MD Initiated Contact with Patient 02/19/19 1323     (approximate)  I have reviewed the triage vital signs and the nursing notes.   HISTORY  Chief Complaint Abdominal Pain; Flank Pain; and Emesis    HPI Rodney Webb is a 63 y.o. male here with acute onset of left flank pain.  The patient states he was in his usual state of health until several hours ago.  He felt well when waking up this morning.  He was riding in the car for work, when he experienced acute onset of a left upper flank pain.  The pain was initially severe, then lightened up, and has since recurred.  Has had associated nausea but no vomiting.  Pain is an aching, gnawing pain in his left flank, that radiates down towards his left groin.  He has had nausea but no vomiting.  No history of kidney stones, but he does admit he is had similar pain in the past.  Denies any diarrhea.  He has had some mild urinary frequency with this.  No dysuria.  No fevers.  Pain is worse intermittently, with no specific aggravating factors.  It does seem to mildly improved when sitting up.        Past Medical History:  Diagnosis Date  . Arthritis    wrists  . GERD (gastroesophageal reflux disease)   . Hard of hearing    both ears  . Hypertension   . Sleep apnea    uses CPAP  . Vertigo    no episodes for 3-4 months    Patient Active Problem List   Diagnosis Date Noted  . Abdominal pain, epigastric   . Other diseases of stomach and duodenum   . Polyp, stomach   . Gastro-esophageal reflux disease without esophagitis 07/03/2015  . H/O respiratory system disease 07/03/2015  . Essential (primary) hypertension 07/03/2015    Past Surgical History:  Procedure Laterality Date  . COLONOSCOPY WITH PROPOFOL N/A 12/25/2015   Procedure: COLONOSCOPY WITH PROPOFOL;  Surgeon: Wallace CullensPaul Y Oh, MD;  [facility];   Service: Gastroenterology;  Laterality: N/A;  . ESOPHAGOGASTRODUODENOSCOPY (EGD) WITH PROPOFOL N/A 12/25/2015   Procedure: ESOPHAGOGASTRODUODENOSCOPY (EGD) WITH PROPOFOL;  Surgeon: Wallace CullensPaul Y Oh, MD;  [facility];  Service: Gastroenterology;  Laterality: N/A;  . ESOPHAGOGASTRODUODENOSCOPY (EGD) WITH PROPOFOL N/A 10/06/2018   Procedure: ESOPHAGOGASTRODUODENOSCOPY (EGD) WITH PROPOFOL;  Surgeon: Midge MiniumWohl, Darren, MD;  [facility];  Service: Endoscopy;  Laterality: N/A;  sleep apnea  . POLYPECTOMY  10/06/2018   Procedure: POLYPECTOMY;  Surgeon: Midge MiniumWohl, Darren, MD;  [facility];  Service: Endoscopy;;  gastroesophageal nodule with polyp  . TONSILLECTOMY      Prior to Admission medications   Medication Sig Start Date End Date Taking? Authorizing Provider  cetirizine (ZYRTEC) 10 MG tablet Take 10 mg by mouth daily.    [provider]  ELDERBERRY PO Take by mouth.    [provider]  esomeprazole (NEXIUM) 20 MG capsule Take by mouth daily.  10/17/15   [provider]  fluticasone (FLONASE) 50 MCG/ACT nasal spray Place 2 sprays into both nostrils daily as needed.     [provider]  Ginkgo Biloba (GNP GINGKO BILOBA EXTRACT PO) Take by mouth.    [provider]  Glucosamine-Chondroit-Vit C-Mn (GLUCOSAMINE CHONDR 500 COMPLEX PO) Take by mouth.    [provider]  losartan (COZAAR) 50 MG tablet Take  50 mg by mouth daily.    [provider]  naproxen (NAPROSYN) 500 MG tablet Take 1 tablet (500 mg total) by mouth 2 (two) times daily as needed for moderate pain. 02/19/19 02/19/20  Shaune PollackIsaacs, Adir Schicker, MD  ondansetron (ZOFRAN ODT) 4 MG disintegrating tablet Take 1 tablet (4 mg total) by mouth every 8 (eight) hours as needed for nausea or vomiting. 02/19/19   Shaune PollackIsaacs, Tresia Revolorio, MD  oxyCODONE-acetaminophen (PERCOCET) 5-325 MG tablet Take 1-2 tablets by mouth every 6 (six) hours as needed for moderate pain or severe pain. 02/19/19  02/19/20  Shaune PollackIsaacs, Killian Schwer, MD  Saw Palmetto, Serenoa repens, (SAW PALMETTO PO) Take by mouth.    [provider]  TURMERIC PO Take by mouth.    [provider]    Allergies Sulfa antibiotics; Advil [ibuprofen]; Pantoprazole; and Penicillins  Family History  Problem Relation Age of Onset  . Diabetes Mother   . Stroke Father     Social History Social History   Tobacco Use  . Smoking status: Never Smoker  . Smokeless tobacco: Former Engineer, waterUser  Substance Use Topics  . Alcohol use: No    Alcohol/week: 0.0 standard drinks  . Drug use: No    Review of Systems Review of Systems  Constitutional: Positive for fatigue. Negative for chills and fever.  HENT: Negative for congestion and rhinorrhea.   Eyes: Negative for visual disturbance.  Respiratory: Negative for cough and shortness of breath.   Gastrointestinal: Negative for abdominal pain, diarrhea, nausea and vomiting.  Genitourinary: Positive for flank pain and frequency. Negative for dysuria.  Skin: Negative for rash and wound.  Neurological: Positive for weakness. Negative for light-headedness.  All other systems reviewed and are negative.    ____________________________________________  PHYSICAL EXAM:  VITAL SIGNS: ED Triage Vitals [02/19/19 1257]  Enc Vitals Group     BP      Pulse      Resp      Temp      Temp src      SpO2      Weight 215 lb (97.5 kg)     Height 6\' 2"  (1.88 m)     Head Circumference      Peak Flow      Pain Score 8     Pain Loc      Pain Edu?      Excl. in GC?     Physical Exam Vitals signs and nursing note reviewed.  Constitutional:      General: He is not in acute distress.    Appearance: He is well-developed.  HENT:     Head: Normocephalic and atraumatic.  Eyes:     Conjunctiva/sclera: Conjunctivae normal.  Neck:     Musculoskeletal: Neck supple.  Cardiovascular:     Rate and Rhythm: Normal rate and regular rhythm.     Heart sounds: Normal heart sounds. No murmur.  No friction rub.  Pulmonary:     Effort: Pulmonary effort is normal. No respiratory distress.     Breath sounds: Normal breath sounds. No wheezing or rales.  Abdominal:     General: There is no distension.     Palpations: Abdomen is soft.     Tenderness: There is abdominal tenderness in the left upper quadrant and left lower quadrant. There is no left CVA tenderness, guarding or rebound.  Skin:    General: Skin is warm.     Capillary Refill: Capillary refill takes less than 2 seconds.  Neurological:     Mental  Status: He is alert and oriented to person, place, and time.     Motor: No abnormal muscle tone.       ____________________________________________   LABS (all labs ordered are listed, but only abnormal results are displayed)  Labs Reviewed  URINALYSIS, COMPLETE (UACMP) WITH MICROSCOPIC - Abnormal; Notable for the following components:      Result Value   Color, Urine YELLOW (*)    APPearance CLEAR (*)    Hgb urine dipstick SMALL (*)    All other components within normal limits  BASIC METABOLIC PANEL - Abnormal; Notable for the following components:   Potassium 3.3 (*)    Glucose, Bld 139 (*)    All other components within normal limits  URINE CULTURE  CBC    ____________________________________________  EKG: N/A ________________________________________  RADIOLOGY All imaging, including plain films, CT scans, and ultrasounds, independently reviewed by me, and interpretations confirmed via formal radiology reads.  ED MD interpretation:  3 mm stone left UVJ with mild hydro  Official radiology report(s): Ct Renal Stone Study  Result Date: 02/19/2019 CLINICAL DATA:  Left flank pain radiating to the left lower quadrant for the past few hours. EXAM: CT ABDOMEN AND PELVIS WITHOUT CONTRAST TECHNIQUE: Multidetector CT imaging of the abdomen and pelvis was performed following the standard protocol without IV contrast. COMPARISON:  CT abdomen pelvis dated October 23, 2015. FINDINGS: Lower chest: No acute abnormality. Unchanged linear scarring in the right middle lobe. Hepatobiliary: No focal liver abnormality is seen. No gallstones, gallbladder wall thickening, or biliary dilatation. Pancreas: Unremarkable. No pancreatic ductal dilatation or surrounding inflammatory changes. Spleen: Normal in size without focal abnormality. Adrenals/Urinary Tract: Adrenal glands are unremarkable. Punctate bilateral renal calculi. 3 mm calculus at the left UVJ. Mild left hydronephrosis. Mild left peripelvic fat stranding. The bladder is largely decompressed. Stomach/Bowel: Stomach is within normal limits. Appendix appears normal. No evidence of bowel wall thickening, distention, or inflammatory changes. Vascular/Lymphatic: No significant vascular findings are present. No enlarged abdominal or pelvic lymph nodes. Reproductive: Prostate is unremarkable. Other: Unchanged tiny fat containing umbilical hernia. No free fluid or pneumoperitoneum. Musculoskeletal: No acute or significant osseous findings. IMPRESSION: 1. 3 mm calculus at the left UVJ with mild left hydronephrosis. 2. Additional punctate bilateral nonobstructive nephrolithiasis. Electronically Signed   By: Titus Dubin M.D.   On: 02/19/2019 14:24    ____________________________________________  PROCEDURES   Procedure(s) performed (including Critical Care):  Procedures  ____________________________________________  INITIAL IMPRESSION / MDM / [place] / ED COURSE  As part of my medical decision making, I reviewed the following data within the electronic MEDICAL RECORD NUMBER Notes from prior ED visits and [place] Controlled Substance Database      *LAMARKUS NEBEL was evaluated in Emergency Department on 02/19/2019 for the symptoms described in the history of present illness. He was evaluated in the context of the global COVID-19 pandemic, which necessitated consideration that the patient might be at risk for infection  with the SARS-CoV-2 virus that causes COVID-19. Institutional protocols and algorithms that pertain to the evaluation of patients at risk for COVID-19 are in a state of rapid change based on information released by regulatory bodies including the CDC and federal and state organizations. These policies and algorithms were followed during the patient's care in the ED.  Some ED evaluations and interventions may be delayed as a result of limited staffing during the pandemic.*      Medical Decision Making: 63 year old male here with acute left flank pain.  Urinalysis shows mild hematuria.  Renal function is at baseline.  CBC without leukocytosis.  No evidence of pyuria, fever, or signs of infection.  Imaging shows 3 mm stone of the left UVJ, which I suspect is etiology for his pain.  His pain and nausea are controlled.  Is tolerating p.o.  Will discharge him with analgesics, Flomax, antiemetics, and outpatient follow-up as needed.  Strict return precautions given.  ____________________________________________  FINAL CLINICAL IMPRESSION(S) / ED DIAGNOSES  Final diagnoses:  Kidney stone on left side     MEDICATIONS GIVEN DURING THIS VISIT:  Medications  ketorolac (TORADOL) 15 MG/ML injection 15 mg (15 mg Intravenous Not Given 02/19/19 1525)  oxyCODONE-acetaminophen (PERCOCET/ROXICET) 5-325 MG per tablet 1 tablet (has no administration in time range)  morphine 4 MG/ML injection 6 mg (6 mg Intravenous Given 02/19/19 1353)  ondansetron (ZOFRAN) injection 4 mg (4 mg Intravenous Given 02/19/19 1352)  sodium chloride 0.9 % bolus 1,000 mL (1,000 mLs Intravenous New Bag/Given 02/19/19 1509)  ketorolac (TORADOL) 30 MG/ML injection 15 mg (15 mg Intravenous Given 02/19/19 1526)     ED Discharge Orders         Ordered    oxyCODONE-acetaminophen (PERCOCET) 5-325 MG tablet  Every 6 hours PRN     02/19/19 1625    ondansetron (ZOFRAN ODT) 4 MG disintegrating tablet  Every 8 hours PRN     02/19/19 1625    naproxen  (NAPROSYN) 500 MG tablet  2 times daily PRN     02/19/19 1625           Note:  This document was prepared using Dragon voice recognition software and may include unintentional dictation errors.   Shaune PollackIsaacs, Orby Tangen, MD 02/19/19 1626

## 2019-02-19 NOTE — ED Triage Notes (Signed)
Sudden onset left side abd/flank pain today.  Currently wretching.  pale

## 2019-02-19 NOTE — ED Notes (Signed)
Upon entering the room, pt found pacing back and forth by bedside retching into an emesis bag and moaning. Pt states pain onset around 1100 this morning. Left side flank pain, 10/10. Pt also states he was able to urinate at approx 1000 this morning but it felt like "weak stream". Pt now states that he has the urge to urinate but is unable to pass any. Denies and Hx of kidney stones or similar Sx.

## 2019-02-19 NOTE — ED Notes (Signed)
Patient transported to CT 

## 2019-02-20 ENCOUNTER — Telehealth: Payer: Self-pay | Admitting: Urology

## 2019-02-20 LAB — URINE CULTURE: Culture: NO GROWTH

## 2019-02-20 NOTE — Telephone Encounter (Signed)
Pt called and needs ER follow up appt for kidney stone. Please advise.

## 2019-02-20 NOTE — Telephone Encounter (Signed)
Please schedule patient with next provider available

## 2019-02-21 ENCOUNTER — Other Ambulatory Visit: Payer: Self-pay

## 2019-02-21 ENCOUNTER — Encounter: Payer: Self-pay | Admitting: Urology

## 2019-02-21 ENCOUNTER — Ambulatory Visit: Payer: BC Managed Care – PPO | Admitting: Urology

## 2019-02-21 VITALS — BP 153/88 | HR 80 | Ht 74.0 in | Wt 213.0 lb

## 2019-02-21 DIAGNOSIS — N132 Hydronephrosis with renal and ureteral calculous obstruction: Secondary | ICD-10-CM | POA: Diagnosis not present

## 2019-02-21 DIAGNOSIS — N201 Calculus of ureter: Secondary | ICD-10-CM

## 2019-02-21 LAB — URINALYSIS, COMPLETE
Bilirubin, UA: NEGATIVE
Glucose, UA: NEGATIVE
Ketones, UA: NEGATIVE
Leukocytes,UA: NEGATIVE
Nitrite, UA: NEGATIVE
Protein,UA: NEGATIVE
Specific Gravity, UA: <1.005 — ABNORMAL LOW (ref 1.005–1.030)
Urobilinogen, Ur: 0.2 mg/dL (ref 0.2–1.0)
pH, UA: 6 (ref 5.0–7.5)

## 2019-02-21 LAB — MICROSCOPIC EXAMINATION
Bacteria, UA: NONE SEEN
Epithelial Cells (non renal): NONE SEEN /hpf (ref 0–10)

## 2019-02-21 NOTE — Patient Instructions (Signed)

## 2019-02-21 NOTE — Progress Notes (Signed)
02/21/2019 1:14 PM   Rodney Webb Mar 02, 1956 [id number]  Referring provider: Jamelle HaringHendrickson, Clifford D, MD [address] [place], [place] [id number]  Chief Complaint  Patient presents with  . Nephrolithiasis    HPI:  Mr. Rodney Webb was referred over for a 3 mm left ureteral stone.  He developed left flank pain a couple of days ago.  A CT scan of the abdomen and pelvis was obtained which showed a 3 mm stone at the left distal ureter just about in the bladder.  His white count was 8.  His urinalysis was normal and culture negative.  Creatinine 1.12.  Today, the patient feels like he passed the stone,. The pain moved to his LLQ and down the penis last night and then resolved. He hasnt had pain today but didn't see the stone pass.   This was first stone event, but had some pain like it years ago. He is constipated. He needed an enema.   Modifying factors: There are no other modifying factors  Associated signs and symptoms: There are no other associated signs and symptoms Aggravating and relieving factors: There are no other aggravating or relieving factors Severity: Moderate Duration: Persistent   PMH: Past Medical History:  Diagnosis Date  . Arthritis    wrists  . GERD (gastroesophageal reflux disease)   . Hard of hearing    both ears  . Hypertension   . Sleep apnea    uses CPAP  . Vertigo    no episodes for 3-4 months    Surgical History: Past Surgical History:  Procedure Laterality Date  . COLONOSCOPY WITH PROPOFOL N/A 12/25/2015   Procedure: COLONOSCOPY WITH PROPOFOL;  Surgeon: Wallace CullensPaul Y Oh, MD;  [facility];  Service: Gastroenterology;  Laterality: N/A;  . ESOPHAGOGASTRODUODENOSCOPY (EGD) WITH PROPOFOL N/A 12/25/2015   Procedure: ESOPHAGOGASTRODUODENOSCOPY (EGD) WITH PROPOFOL;  Surgeon: Wallace CullensPaul Y Oh, MD;  [facility];  Service: Gastroenterology;  Laterality: N/A;  . ESOPHAGOGASTRODUODENOSCOPY (EGD) WITH PROPOFOL N/A 10/06/2018   Procedure:  ESOPHAGOGASTRODUODENOSCOPY (EGD) WITH PROPOFOL;  Surgeon: Midge MiniumWohl, Darren, MD;  [facility];  Service: Endoscopy;  Laterality: N/A;  sleep apnea  . POLYPECTOMY  10/06/2018   Procedure: POLYPECTOMY;  Surgeon: Midge MiniumWohl, Darren, MD;  [facility];  Service: Endoscopy;;  gastroesophageal nodule with polyp  . TONSILLECTOMY      Home Medications:  Allergies as of 02/21/2019      Reactions   Sulfa Antibiotics Rash, Itching   Shortness of breath   Advil [ibuprofen]    Pt says only causes issues if taken too close to PPI   Pantoprazole Diarrhea   Pt says this occurs if taken within 2 hrs of foods or other meds   Penicillins Rash      Medication List       Accurate as of February 21, 2019  1:14 PM. If you have any questions, ask your nurse or doctor.        STOP taking these medications   cetirizine 10 MG tablet Commonly known as:  ZYRTEC Stopped by:  Jerilee FieldMatthew Sirr Kabel, MD     TAKE these medications   ELDERBERRY PO Take by mouth.   esomeprazole 20 MG capsule Commonly known as:  NEXIUM Take by mouth daily.   fexofenadine-pseudoephedrine 60-120 MG 12 hr tablet Commonly known as:  ALLEGRA-D Take 1 tablet by mouth 2 (two) times daily.   fluticasone 50 MCG/ACT nasal spray Commonly known as:  FLONASE Place 2 sprays into both nostrils daily as  needed.   GLUCOSAMINE CHONDR 500 COMPLEX PO Take by mouth.   GNP GINGKO BILOBA EXTRACT PO Take by mouth.   losartan 50 MG tablet Commonly known as:  COZAAR Take 50 mg by mouth daily.   naproxen 500 MG tablet Commonly known as:  Naprosyn Take 1 tablet (500 mg total) by mouth 2 (two) times daily as needed for moderate pain.   ondansetron 4 MG disintegrating tablet Commonly known as:  Zofran ODT Take 1 tablet (4 mg total) by mouth every 8 (eight) hours as needed for nausea or vomiting.   oxyCODONE-acetaminophen 5-325 MG tablet Commonly known as:  Percocet Take 1-2 tablets by mouth every 6 (six) hours as needed  for moderate pain or severe pain.   SAW PALMETTO PO Take by mouth.   TURMERIC PO Take by mouth.       Allergies:  Allergies  Allergen Reactions  . Sulfa Antibiotics Rash and Itching    Shortness of breath  . Advil [Ibuprofen]     Pt says only causes issues if taken too close to PPI  . Pantoprazole Diarrhea    Pt says this occurs if taken within 2 hrs of foods or other meds  . Penicillins Rash    Family History: Family History  Problem Relation Age of Onset  . Diabetes Mother   . Stroke Father     Social History:  reports that he has never smoked. He has quit using smokeless tobacco. He reports that he does not drink alcohol or use drugs.  ROS:                                        Physical Exam: BP (!) 153/88 (BP Location: Left Arm, Patient Position: Sitting, Cuff Size: Normal)   Pulse 80   Ht 6\' 2"  (1.88 m)   Wt 96.6 kg   BMI 27.35 kg/m   Constitutional:  Alert and oriented, No acute distress. HEENT: [name] AT, moist mucus membranes.  Trachea midline, no masses. Cardiovascular: No clubbing, cyanosis, or edema. Respiratory: Normal respiratory effort, no increased work of breathing. GI: Abdomen is soft, nontender, nondistended, no abdominal masses GU: No CVA tenderness Skin: No rashes, bruises or suspicious lesions. Neurologic: Grossly intact, no focal deficits, moving all 4 extremities. Psychiatric: Normal mood and affect.  Laboratory Data: Lab Results  Component Value Date   WBC 8.4 02/19/2019   HGB 15.5 02/19/2019   HCT 44.4 02/19/2019   MCV 91.5 02/19/2019   PLT 286 02/19/2019    Lab Results  Component Value Date   CREATININE 1.12 02/19/2019    No results found for: PSA  No results found for: TESTOSTERONE  No results found for: HGBA1C  Urinalysis    Component Value Date/Time   COLORURINE YELLOW (A) 02/19/2019 1359   APPEARANCEUR CLEAR (A) 02/19/2019 1359   APPEARANCEUR Clear 10/27/2016 0831   LABSPEC 1.018  02/19/2019 1359   PHURINE 5.0 02/19/2019 1359   GLUCOSEU NEGATIVE 02/19/2019 1359   HGBUR SMALL (A) 02/19/2019 1359   BILIRUBINUR NEGATIVE 02/19/2019 1359   BILIRUBINUR Negative 10/27/2016 0831   KETONESUR NEGATIVE 02/19/2019 1359   PROTEINUR NEGATIVE 02/19/2019 1359   NITRITE NEGATIVE 02/19/2019 1359   LEUKOCYTESUR NEGATIVE 02/19/2019 1359    Lab Results  Component Value Date   LABMICR See below: 10/27/2016   WBCUA 0-5 10/27/2016   RBCUA 3-10 (A) 10/27/2016   LABEPIT 0-10 10/27/2016  BACTERIA NONE SEEN 02/19/2019    Pertinent Imaging: CT scan  No results found for this or any previous visit. No results found for this or any previous visit. No results found for this or any previous visit. No results found for this or any previous visit. No results found for this or any previous visit. No results found for this or any previous visit. No results found for this or any previous visit. Results for orders placed during the hospital encounter of 02/19/19  CT Renal Stone Study   Narrative CLINICAL DATA:  Left flank pain radiating to the left lower quadrant for the past few hours.  EXAM: CT ABDOMEN AND PELVIS WITHOUT CONTRAST  TECHNIQUE: Multidetector CT imaging of the abdomen and pelvis was performed following the standard protocol without IV contrast.  COMPARISON:  CT abdomen pelvis dated October 23, 2015.  FINDINGS: Lower chest: No acute abnormality. Unchanged linear scarring in the right middle lobe.  Hepatobiliary: No focal liver abnormality is seen. No gallstones, gallbladder wall thickening, or biliary dilatation.  Pancreas: Unremarkable. No pancreatic ductal dilatation or surrounding inflammatory changes.  Spleen: Normal in size without focal abnormality.  Adrenals/Urinary Tract: Adrenal glands are unremarkable. Punctate bilateral renal calculi. 3 mm calculus at the left UVJ. Mild left hydronephrosis. Mild left peripelvic fat stranding. The bladder is  largely decompressed.  Stomach/Bowel: Stomach is within normal limits. Appendix appears normal. No evidence of bowel wall thickening, distention, or inflammatory changes.  Vascular/Lymphatic: No significant vascular findings are present. No enlarged abdominal or pelvic lymph nodes.  Reproductive: Prostate is unremarkable.  Other: Unchanged tiny fat containing umbilical hernia. No free fluid or pneumoperitoneum.  Musculoskeletal: No acute or significant osseous findings.  IMPRESSION: 1. 3 mm calculus at the left UVJ with mild left hydronephrosis. 2. Additional punctate bilateral nonobstructive nephrolithiasis.   Electronically Signed   By: Titus Dubin M.D.   On: 02/19/2019 14:24     Assessment & Plan:    1. Left ureteral stone  He had frequency and urgency, pain in LLQ and penis last night, all of which have resolved, so he likely passed the stone, but we want to make sure the hydronephrosis resolved. Check [place].  - Urinalysis, Complete   No follow-ups on file.  Festus Aloe, MD  [facility] [address] [place], [place] [address] [phone]

## 2019-02-22 ENCOUNTER — Ambulatory Visit: Payer: BLUE CROSS/BLUE SHIELD | Admitting: Urology

## 2019-03-05 ENCOUNTER — Ambulatory Visit
Admission: RE | Admit: 2019-03-05 | Discharge: 2019-03-05 | Disposition: A | Discharge status: Home / Self Care | Payer: BC Managed Care – PPO | Source: Ambulatory Visit | Attending: Urology | Admitting: Urology

## 2019-03-05 ENCOUNTER — Other Ambulatory Visit: Payer: Self-pay

## 2019-03-05 DIAGNOSIS — N201 Calculus of ureter: Secondary | ICD-10-CM | POA: Insufficient documentation

## 2019-03-07 ENCOUNTER — Telehealth: Payer: Self-pay

## 2019-03-07 NOTE — Telephone Encounter (Signed)
-----   Message from Festus Aloe, MD sent at 03/06/2019  2:03 PM EDT ----- Please let the patient know his kidneys and bladder look normal on the ultrasound. No sign of any stone blockage or kidney swelling. I can see him back as needed. Thanks.  ----- Message ----- From: Feliberto Gottron, CMA Sent: 03/05/2019   3:36 PM EDT To: Festus Aloe, MD   ----- Message ----- From: Interface, Rad Results In Sent: 03/05/2019   1:54 PM EDT To: [facility]

## 2019-03-07 NOTE — Telephone Encounter (Signed)
Patient returned call. Advised of [place] results, patient verbalized understanding.

## 2019-03-07 NOTE — Telephone Encounter (Signed)
Called and left detailed message on voicemail per DPR of [place] results from Dr Junious Silk.

## 2019-03-28 DIAGNOSIS — H40003 Preglaucoma, unspecified, bilateral: Secondary | ICD-10-CM | POA: Diagnosis not present

## 2019-04-04 DIAGNOSIS — H401131 Primary open-angle glaucoma, bilateral, mild stage: Secondary | ICD-10-CM | POA: Diagnosis not present

## 2019-04-25 ENCOUNTER — Other Ambulatory Visit: Payer: Self-pay

## 2019-04-25 ENCOUNTER — Encounter: Payer: Self-pay | Admitting: Internal Medicine

## 2019-04-25 ENCOUNTER — Ambulatory Visit: Payer: Managed Care, Other (non HMO) | Admitting: Internal Medicine

## 2019-04-25 VITALS — BP 148/102 | HR 63 | Temp 98.1°F | Resp 16 | Ht 74.0 in | Wt 214.0 lb

## 2019-04-25 DIAGNOSIS — I10 Essential (primary) hypertension: Secondary | ICD-10-CM

## 2019-04-25 DIAGNOSIS — K760 Fatty (change of) liver, not elsewhere classified: Secondary | ICD-10-CM

## 2019-04-25 DIAGNOSIS — E785 Hyperlipidemia, unspecified: Secondary | ICD-10-CM | POA: Insufficient documentation

## 2019-04-25 DIAGNOSIS — G4733 Obstructive sleep apnea (adult) (pediatric): Secondary | ICD-10-CM | POA: Insufficient documentation

## 2019-04-25 DIAGNOSIS — R252 Cramp and spasm: Secondary | ICD-10-CM

## 2019-04-25 DIAGNOSIS — E7849 Other hyperlipidemia: Secondary | ICD-10-CM

## 2019-04-25 DIAGNOSIS — E663 Overweight: Secondary | ICD-10-CM

## 2019-04-25 MED ORDER — LOSARTAN POTASSIUM-HCTZ 100-25 MG PO TABS: 1.0000 | ORAL_TABLET | Freq: Every day | ORAL | 3 refills | Status: DC

## 2019-04-25 NOTE — Patient Instructions (Signed)
Continue to check blood pressures at home and bring the readings with you for your next visit

## 2019-04-25 NOTE — Progress Notes (Addendum)
S - Patient with a h/o HTN who follows-up for BP check-up. Was higher on recent visits noted Has checked BP's at home and noted bottom # in 90's, top # in 130's consistently. Is taking BP meds and not missing doses, and taking the losartan 50mg  bid rather than 100mg  in the am. Notes occas HA's at night and has had more neck discomfort at times that may be contributing. Also saw the eye doctor in last 3 weeks and put on drops to lessen his pressures as has a FH of glaucoma and the doctor wanted his pressures to be lowered although not told he has glaucoma.   He had a kidney stone in June and saw urology and it passed and had an U/S after (6/22) and was ok.   His last annual labs were done in October 2019 and due again for these in October 2020.   Has been feeling well with no other complaints except did note he has had more cramping episodes in his lower extremities at night in the recent past (has been much warmer temps over that time). He tries to stay hydrated at times. Uses support hose which really help but notes they get warm at night.  No CP, palp's, SOB, abdominal pains, marked fatigue, vision changes, LE swelling.    Never smoker, former chew, quit 2000  Allergies  Allergen Reactions  . Sulfa Antibiotics Rash and Itching    Shortness of breath  . Penicillins Rash    Current Outpatient Medications on File Prior to Visit  Medication Sig Dispense Refill  . ELDERBERRY PO Take by mouth.    . esomeprazole (NEXIUM) 20 MG capsule Take by mouth daily.     . fexofenadine-pseudoephedrine (ALLEGRA-D) 60-120 MG 12 hr tablet Take 1 tablet by mouth daily.     . fluticasone (FLONASE) 50 MCG/ACT nasal spray Place 2 sprays into both nostrils daily as needed.     . Glucosamine-Chondroit-Vit C-Mn (GLUCOSAMINE CHONDR 500 COMPLEX PO) Take by mouth.    . latanoprost (XALATAN) 0.005 % ophthalmic solution INT 1 GTT IN OU ATN    . losartan (COZAAR) 50 MG tablet Take 50 mg by mouth 2 (two) times daily.      . Multiple Vitamin (MULTIVITAMIN) tablet Take 1 tablet by mouth daily.     No current facility-administered medications on file prior to visit.    FH - M - DM, F - stroke  He noted he was denied life insurance as he had a liver biopsy in the past to make sure liver was ok and told was fatty liver and no other concerns. Also noted that his A1C was slightly elevated with his FBS in the 80s.  He noted int he past when he was on a lisinopril product with a diuretic, he pee'ed a lot and noted this concern with adding a diuretic to his losartan    O - NAD, masked   BP (!) 148/102 (BP Location: Right Arm, Patient Position: Sitting, Cuff Size: Large)   Pulse 63   Temp 98.1 F (36.7 C) (Oral)   Resp 16   Ht 6\' 2"  (1.88 m)   Wt 214 lb (97.1 kg)   SpO2 97%   BMI 27.48 kg/m   147/99 on recheck by me with machine, (150's/90's on checks in May and June visits)   HEENT - sclera anicteric, PERRL Neck - carotids 2+ and = with no bruits Car - RRR without m/g/r Pulm - CTA Back - no CVA tenderness  Ext - no LE edema Neuro - Affect not flat, approp with conversation   Last labs reviewed from 06/2018 - BUN/Cr - 15/0.89, glc - normal, TC - 203/LDL - 136   A/P - 1. HTN - concern not as well controlled and he agreed  Continue current BP medication to manage, discussed increase or addition and will add HCTZ - 25 mg to losartan 100mg  and take one pill in am (not split and take a second later in day with the diuretic added ) Stay well hydrated emphasized ( help with kidney stone history as well) Discussed goals for good control of BP  Continue with home BP checks periodically and keep a log discussed, and if SBP > 140 or DBP > 90 with any regularity, needs to f/u sooner than planned in October for annual PE  2. Leg cramps - much better when wears support hose and continue doing  Also, staying well hydrated in these warmer weather months especially and with the change in medicines above Will check  labs with annual PE upcoming, but if more problematic, will f/u sooner and may need to check sooner  3. Hyperlipidemia  Will have f/u labs to recheck with annual PE  4. Fatty liver history  5. Recent kidney stone - as noted above  6. Overweight noted - weight at 214 last few visits and stable  7. Sleep apnea - on CPAP   History update: Colonoscopy and EGD in 2017 (abdominal pain) - nodule in esophagus noted and rec'ed a repeat EGD in one year to re-evaluate the nodule (was inflammatory on bx) Repeat EGD done 10/06/2018 - biopsy was benign but needed an EUS for a gastric nodule found. Done 11/08/2018.  CT suggestive of cirrhosis and sent to GI and liver bx done and no signs of cirrhosis noted on bx.  F/u  liver enzymes in October normal.

## 2019-05-07 ENCOUNTER — Encounter: Payer: Self-pay | Admitting: Internal Medicine

## 2019-05-07 ENCOUNTER — Ambulatory Visit: Payer: Managed Care, Other (non HMO) | Admitting: Registered Nurse

## 2019-05-07 ENCOUNTER — Other Ambulatory Visit: Payer: Self-pay

## 2019-05-07 VITALS — BP 143/88 | HR 80 | Temp 98.0°F | Resp 14 | Ht 74.0 in | Wt 214.0 lb

## 2019-05-07 DIAGNOSIS — I10 Essential (primary) hypertension: Secondary | ICD-10-CM

## 2019-05-07 MED ORDER — HYDROCHLOROTHIAZIDE 25 MG PO TABS: 12.5000 mg | ORAL_TABLET | Freq: Every day | ORAL | 0 refills | Status: DC

## 2019-05-07 NOTE — Patient Instructions (Signed)
Hypotension As your heart beats, it forces blood through your body. Hypotension, commonly called low blood pressure, is when the force of blood pumping through your arteries is too weak. Arteries are blood vessels that carry blood from the heart throughout the body. Depending on the cause and severity, hypotension may be harmless (benign) or may cause serious problems (be critical). When blood pressure is too low, you may not get enough blood to your brain or to the rest of your organs. This can cause weakness, light-headedness, rapid heartbeat, and fainting. What are the causes? This condition may be caused by:  Blood loss.  Loss of body fluids (dehydration).  Heart problems.  Hormone (endocrine) problems.  Pregnancy.  Severe infection.  Lack of certain nutrients.  Severe allergic reactions (anaphylaxis).  Certain medicines, such as blood pressure medicine or medicines that make the body lose excess fluids (diuretics). Sometimes, hypotension may be caused by not taking medicine as directed, such as taking too much of a certain medicine. What increases the risk? The following factors may make you more likely to develop this condition:  Age. Risk increases as you get older.  Conditions that affect the heart or the central nervous system.  Taking certain medicines, such as blood pressure medicine or diuretics.  Being pregnant. What are the signs or symptoms? Common symptoms of this condition include:  Weakness.  Light-headedness.  Dizziness.  Blurred vision.  Fatigue.  Rapid heartbeat.  Fainting, in severe cases. How is this diagnosed? This condition is diagnosed based on:  Your medical history.  Your symptoms.  Your blood pressure measurement. Your health care provider will check your blood pressure when you are: ? Lying down. ? Sitting. ? Standing. A blood pressure reading is recorded as two numbers, such as "120 over 80" (or 120/80). The first ("top")  number is called the systolic pressure. It is a measure of the pressure in your arteries as your heart beats. The second ("bottom") number is called the diastolic pressure. It is a measure of the pressure in your arteries when your heart relaxes between beats. Blood pressure is measured in a unit called mm Hg. Healthy blood pressure for most adults is 120/80. If your blood pressure is below 90/60, you may be diagnosed with hypotension. Other information or tests that may be used to diagnose hypotension include:  Your other vital signs, such as your heart rate and temperature.  Blood tests.  Tilt table test. For this test, you will be safely secured to a table that moves you from a lying position to an upright position. Your heart rhythm and blood pressure will be monitored during the test. How is this treated? Treatment for this condition may include:  Changing your diet. This may involve eating more salt (sodium) or drinking more water.  Taking medicines to raise your blood pressure.  Changing the dosage of certain medicines you are taking that might be lowering your blood pressure.  Wearing compression stockings. These stockings help to prevent blood clots and reduce swelling in your legs. In some cases, you may need to go to the hospital for:  Fluid replacement. This means you will receive fluids through an IV.  Blood replacement. This means you will receive donated blood through an IV (transfusion).  Treating an infection or heart problems, if this applies.  Monitoring. You may need to be monitored while medicines that you are taking wear off. Follow these instructions at home: Eating and drinking   Drink enough fluid to keep your  urine pale yellow.  Eat a healthy diet, and follow instructions from your health care provider about eating or drinking restrictions. A healthy diet includes: ? Fresh fruits and vegetables. ? Whole grains. ? Lean meats. ? Low-fat dairy products.   Eat extra salt only as directed. Do not add extra salt to your diet unless your health care provider told you to do that.  Eat frequent, small meals.  Avoid standing up suddenly after eating. Medicines  Take over-the-counter and prescription medicines only as told by your health care provider. ? Follow instructions from your health care provider about changing the dosage of your current medicines, if this applies. ? Do not stop or adjust any of your medicines on your own. General instructions   Wear compression stockings as told by your health care provider.  Get up slowly from lying down or sitting positions. This gives your blood pressure a chance to adjust.  Avoid hot showers and excessive heat as directed by your health care provider.  Return to your normal activities as told by your health care provider. Ask your health care provider what activities are safe for you.  Do not use any products that contain nicotine or tobacco, such as cigarettes, e-cigarettes, and chewing tobacco. If you need help quitting, ask your health care provider.  Keep all follow-up visits as told by your health care provider. This is important. Contact a health care provider if you:  Vomit.  Have diarrhea.  Have a fever for more than 2-3 days.  Feel more thirsty than usual.  Feel weak and tired. Get help right away if you:  Have chest pain.  Have a fast or irregular heartbeat.  Develop numbness in any part of your body.  Cannot move your arms or your legs.  Have trouble speaking.  Become sweaty or feel light-headed.  Faint.  Feel short of breath.  Have trouble staying awake.  Feel confused. Summary  Hypotension is when the force of blood pumping through your arteries is too weak.  Hypotension may be harmless (benign) or may cause serious problems (be critical).  Treatment for this condition may include changing your diet, changing your medicines, and wearing compression  stockings.  In some cases, you may need to go to the hospital for fluid or blood replacement. This information is not intended to replace advice given to you by your health care provider. Make sure you discuss any questions you have with your health care provider. Document Released: 08/30/2005 Document Revised: 02/23/2018 Document Reviewed: 02/23/2018 Elsevier Patient Education  2020 [employer] Managing Your Hypertension Hypertension is commonly called high blood pressure. This is when the force of your blood pressing against the walls of your arteries is too strong. Arteries are blood vessels that carry blood from your heart throughout your body. Hypertension forces the heart to work harder to pump blood, and may cause the arteries to become narrow or stiff. Having untreated or uncontrolled hypertension can cause heart attack, stroke, kidney disease, and other problems. What are blood pressure readings? A blood pressure reading consists of a higher number over a lower number. Ideally, your blood pressure should be below 120/80. The first ("top") number is called the systolic pressure. It is a measure of the pressure in your arteries as your heart beats. The second ("bottom") number is called the diastolic pressure. It is a measure of the pressure in your arteries as the heart relaxes. What does my blood pressure reading mean? Blood pressure is classified into four  stages. Based on your blood pressure reading, your health care provider may use the following stages to determine what type of treatment you need, if any. Systolic pressure and diastolic pressure are measured in a unit called mm Hg. Normal  Systolic pressure: below 120.  Diastolic pressure: below 80. Elevated  Systolic pressure: 120-129.  Diastolic pressure: below 80. Hypertension stage 1  Systolic pressure: 130-139.  Diastolic pressure: 80-89. Hypertension stage 2  Systolic pressure: 140 or above.  Diastolic pressure:  90 or above. What health risks are associated with hypertension? Managing your hypertension is an important responsibility. Uncontrolled hypertension can lead to:  A heart attack.  A stroke.  A weakened blood vessel (aneurysm).  Heart failure.  Kidney damage.  Eye damage.  Metabolic syndrome.  Memory and concentration problems. What changes can I make to manage my hypertension? Hypertension can be managed by making lifestyle changes and possibly by taking medicines. Your health care provider will help you make a plan to bring your blood pressure within a normal range. Eating and drinking   Eat a diet that is high in fiber and potassium, and low in salt (sodium), added sugar, and fat. An example eating plan is called the DASH (Dietary Approaches to Stop Hypertension) diet. To eat this way: ? Eat plenty of fresh fruits and vegetables. Try to fill half of your plate at each meal with fruits and vegetables. ? Eat whole grains, such as whole wheat pasta, brown rice, or whole grain bread. Fill about one quarter of your plate with whole grains. ? Eat low-fat diary products. ? Avoid fatty cuts of meat, processed or cured meats, and poultry with skin. Fill about one quarter of your plate with lean proteins such as fish, chicken without skin, beans, eggs, and tofu. ? Avoid premade and processed foods. These tend to be higher in sodium, added sugar, and fat.  Reduce your daily sodium intake. Most people with hypertension should eat less than 1,500 mg of sodium a day.  Limit alcohol intake to no more than 1 drink a day for nonpregnant women and 2 drinks a day for men. One drink equals 12 oz of beer, 5 oz of wine, or 1 oz of hard liquor. Lifestyle  Work with your health care provider to maintain a healthy body weight, or to lose weight. Ask what an ideal weight is for you.  Get at least 30 minutes of exercise that causes your heart to beat faster (aerobic exercise) most days of the week.  Activities may include walking, swimming, or biking.  Include exercise to strengthen your muscles (resistance exercise), such as weight lifting, as part of your weekly exercise routine. Try to do these types of exercises for 30 minutes at least 3 days a week.  Do not use any products that contain nicotine or tobacco, such as cigarettes and e-cigarettes. If you need help quitting, ask your health care provider.  Control any long-term (chronic) conditions you have, such as high cholesterol or diabetes. Monitoring  Monitor your blood pressure at home as told by your health care provider. Your personal target blood pressure may vary depending on your medical conditions, your age, and other factors.  Have your blood pressure checked regularly, as often as told by your health care provider. Working with your health care provider  Review all the medicines you take with your health care provider because there may be side effects or interactions.  Talk with your health care provider about your diet, exercise habits, and  other lifestyle factors that may be contributing to hypertension.  Visit your health care provider regularly. Your health care provider can help you create and adjust your plan for managing hypertension. Will I need medicine to control my blood pressure? Your health care provider may prescribe medicine if lifestyle changes are not enough to get your blood pressure under control, and if:  Your systolic blood pressure is 130 or higher.  Your diastolic blood pressure is 80 or higher. Take medicines only as told by your health care provider. Follow the directions carefully. Blood pressure medicines must be taken as prescribed. The medicine does not work as well when you skip doses. Skipping doses also puts you at risk for problems. Contact a health care provider if:  You think you are having a reaction to medicines you have taken.  You have repeated (recurrent) headaches.  You feel  dizzy.  You have swelling in your ankles.  You have trouble with your vision. Get help right away if:  You develop a severe headache or confusion.  You have unusual weakness or numbness, or you feel faint.  You have severe pain in your chest or abdomen.  You vomit repeatedly.  You have trouble breathing. Summary  Hypertension is when the force of blood pumping through your arteries is too strong. If this condition is not controlled, it may put you at risk for serious complications.  Your personal target blood pressure may vary depending on your medical conditions, your age, and other factors. For most people, a normal blood pressure is less than 120/80.  Hypertension is managed by lifestyle changes, medicines, or both. Lifestyle changes include weight loss, eating a healthy, low-sodium diet, exercising more, and limiting alcohol. This information is not intended to replace advice given to you by your health care provider. Make sure you discuss any questions you have with your health care provider. Document Released: 05/24/2012 Document Revised: 12/22/2018 Document Reviewed: 07/28/2016 Elsevier Patient Education  2020 [employer].

## 2019-05-07 NOTE — Progress Notes (Signed)
Subjective:    Patient ID: Rodney Webb, male    DOB: [birth year], 63 y.o.   MRN: [id number]  63y/o caucasian male established patient to clinic new to me seen by Dr Dorris FetchHendrickson and hydrochlorothiazide (HCTZ) added to his BP regimen 04/25/2019.  Feeling off since starting combined hydrochlorothiazide losartan  25/100mg ; took his blood pressures at home 121/83 05/04/2019 0730; 114/74 05/04/19@ 1300; 97/69 05/05/2019 2030  107/74 05/06/2019 0800; 94/66 05/06/2019 1800; 128/87 05/07/2019 @0730   Feels better if he drinks caffeine but wears off after an hour. Started drinking a body armour electrolyte drink once a day after starting hydrochlorothiazide.  Losartan/HCTZ in single tablet now.  He has leftover 100mg  and 50mg  plain losartans (30 day supply).  Wondering if we can decrease dose on HCTZ. Leg cramps stopped after putting bar of soap in bed sheets.  Labs due this fall annual.  He denied history of electrolytes being low.  Denied palpitations/leg swelling/dizzyness.  New sleep apnea mask working well and sleeping good.  CDL driver license now inactive.  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  Has noticed muscle soreness neck with fall farming chores/more hours on tractor.      Review of Systems  Constitutional: Positive for fatigue. Negative for activity change, appetite change, chills, diaphoresis and fever.  HENT: Negative for trouble swallowing and voice change.   Eyes: Negative for photophobia and visual disturbance.  Respiratory: Negative for cough, choking, shortness of breath, wheezing and stridor.   Cardiovascular: Negative for palpitations.  Gastrointestinal: Negative for diarrhea, nausea and vomiting.  Endocrine: Negative for cold intolerance and heat intolerance.  Genitourinary: Negative for difficulty urinating.  Musculoskeletal: Negative for gait problem, joint swelling, myalgias, neck pain and neck stiffness.  Skin: Negative for color change, pallor, rash and  wound.  Allergic/Immunologic: Negative for food allergies.  Neurological: Negative for dizziness, tremors, seizures, syncope, facial asymmetry, speech difficulty, weakness, light-headedness, numbness and headaches.  Hematological: Negative for adenopathy. Does not bruise/bleed easily.  Psychiatric/Behavioral: Negative for agitation, confusion and sleep disturbance.       Objective:   Physical Exam Vitals signs reviewed.  Constitutional:      General: He is awake. He is not in acute distress.    Appearance: Normal appearance. He is well-developed and well-groomed. He is not ill-appearing, toxic-appearing or diaphoretic.  HENT:     Head: Normocephalic and atraumatic.     Jaw: There is normal jaw occlusion.     Right Ear: Hearing and external ear normal.     Left Ear: Hearing and external ear normal.     Nose: Nose normal.     Mouth/Throat:     Lips: Pink. No lesions.     Mouth: Mucous membranes are moist.     Pharynx: Oropharynx is clear. Uvula midline.  Eyes:     General: Lids are normal. Vision grossly intact. Gaze aligned appropriately. No allergic shiner, visual field deficit or scleral icterus.       Right eye: No discharge.        Left eye: No discharge.     Extraocular Movements: Extraocular movements intact.     Conjunctiva/sclera: Conjunctivae normal.     Pupils: Pupils are equal, round, and reactive to light.  Neck:     Musculoskeletal: Normal range of motion and neck supple. Normal range of motion. No edema, erythema, neck rigidity, crepitus, injury, pain with movement, torticollis or muscular tenderness.     Trachea: Trachea and phonation normal.  Cardiovascular:  Rate and Rhythm: Normal rate and regular rhythm.     Pulses: Normal pulses.          Radial pulses are 2+ on the right side and 2+ on the left side.     Heart sounds: Normal heart sounds.  Pulmonary:     Effort: Pulmonary effort is normal. No respiratory distress.     Breath sounds: Normal breath sounds  and air entry. No stridor, decreased air movement or transmitted upper airway sounds. No decreased breath sounds, wheezing, rhonchi or rales.     Comments: Wearing cloth mask due to covid 19 pandemic; spoke full sentences without difficulty; gait sure and steady in hallway Abdominal:     General: Abdomen is flat.     Palpations: Abdomen is soft.  Musculoskeletal: Normal range of motion.        General: No swelling, tenderness, deformity or signs of injury.     Right shoulder: Normal.     Left shoulder: Normal.     Right elbow: Normal.    Left elbow: Normal.     Right hip: Normal.     Left hip: Normal.     Right knee: Normal.     Left knee: Normal.     Right ankle: Normal.     Left ankle: Normal.     Thoracic back: Normal.     Lumbar back: Normal.     Right hand: Normal.     Left hand: Normal.     Right lower leg: No edema.     Left lower leg: No edema.  Lymphadenopathy:     Head:     Right side of head: No submental, submandibular, tonsillar, preauricular, posterior auricular or occipital adenopathy.     Left side of head: No submental, submandibular, tonsillar, preauricular, posterior auricular or occipital adenopathy.     Cervical: No cervical adenopathy.     Right cervical: No superficial cervical adenopathy.    Left cervical: No superficial cervical adenopathy.  Skin:    General: Skin is warm and dry.     Capillary Refill: Capillary refill takes less than 2 seconds.     Coloration: Skin is not ashen, cyanotic, jaundiced, mottled, pale or sallow.     Findings: No abrasion, abscess, acne, bruising, burn, ecchymosis, erythema, signs of injury, laceration, lesion, petechiae, rash or wound.     Nails: There is no clubbing.   Neurological:     General: No focal deficit present.     Mental Status: He is alert and oriented to person, place, and time. Mental status is at baseline.     GCS: GCS eye subscore is 4. GCS verbal subscore is 5. GCS motor subscore is 6.     Cranial  Nerves: Cranial nerves are intact. No cranial nerve deficit, dysarthria or facial asymmetry.     Sensory: Sensation is intact. No sensory deficit.     Motor: Motor function is intact. No weakness, tremor, atrophy, abnormal muscle tone or seizure activity.     Coordination: Coordination is intact. Coordination normal.     Gait: Gait is intact. Gait normal.     Comments: On/off exam table without difficulty; gait sure and steady in hallway; bilateral hand grasp equal  Psychiatric:        Attention and Perception: Attention and perception normal.        Mood and Affect: Mood and affect normal.        Speech: Speech normal.        Behavior:  Behavior normal. Behavior is cooperative.        Thought Content: Thought content normal.        Cognition and Memory: Cognition and memory normal.        Judgment: Judgment normal.           Assessment & Plan:  A-essential hypertension  P-not tolerating hydrochlorothiazide 25mg  po daily.  Will decrease to 12.5mg  (1/2 tab) po daily.  Patient to continue BP log at home and bring to next appt.  Discussed BP goal less than 120/70 and no hypotension symptoms.  If having hypotension symptoms goal raised to 120/80.  Discussed options to take losartan pm and hydrochlorothizide am or decrease hydrochlorothiazide.  Patient preferred trial decreased hydrochlorothiazide first.  New electronic Rx entered for patient to his preferred pharmacy.  Hydrochlorothiazide 25mg  sig take 1/2-1 tablet po qam #30 RF0 #30 RF0.  Exitcare handout printed and given on managing BP and hypotension.  Discussed with patient sometimes we have to do step down on BP so body has time to adjust to lower "normal" blood pressure after living at higher numbers.  We are trying to prevent stroke but also do not want his blood pressure getting so low he passes out.  Avoid dehydration. Slow position changes.  If BP low drinking fluids/caffeine will help to raise it up along with being active.   Patient  verbalized understanding information/instructions, agreed with plan of care and had no further questions at this time.

## 2019-05-16 DIAGNOSIS — H401131 Primary open-angle glaucoma, bilateral, mild stage: Secondary | ICD-10-CM | POA: Diagnosis not present

## 2019-07-03 ENCOUNTER — Other Ambulatory Visit: Payer: Self-pay

## 2019-07-03 DIAGNOSIS — I10 Essential (primary) hypertension: Secondary | ICD-10-CM

## 2019-07-03 MED ORDER — HYDROCHLOROTHIAZIDE 25 MG PO TABS: 12.5000 mg | ORAL_TABLET | Freq: Every day | ORAL | 1 refills | Status: DC

## 2019-07-18 ENCOUNTER — Ambulatory Visit: Payer: Managed Care, Other (non HMO)

## 2019-07-18 ENCOUNTER — Other Ambulatory Visit: Payer: Self-pay

## 2019-07-18 DIAGNOSIS — Z0000 Encounter for general adult medical examination without abnormal findings: Secondary | ICD-10-CM

## 2019-07-18 LAB — POCT URINALYSIS DIPSTICK
Bilirubin, UA: NEGATIVE
Blood, UA: NEGATIVE
Glucose, UA: NEGATIVE
Ketones, UA: NEGATIVE
Leukocytes, UA: NEGATIVE
Nitrite, UA: NEGATIVE
Protein, UA: NEGATIVE
Spec Grav, UA: 1.015 (ref 1.010–1.025)
Urobilinogen, UA: 0.2 E.U./dL
pH, UA: 7 (ref 5.0–8.0)

## 2019-07-19 LAB — CMP12+LP+TP+TSH+6AC+PSA+CBC…
ALT: 23 IU/L (ref 0–44)
AST: 26 IU/L (ref 0–40)
Albumin/Globulin Ratio: 1.5 (ref 1.2–2.2)
Albumin: 4.4 g/dL (ref 3.8–4.8)
Alkaline Phosphatase: 59 IU/L (ref 39–117)
BUN/Creatinine Ratio: 16 (ref 10–24)
BUN: 14 mg/dL (ref 8–27)
Basophils Absolute: 0 10*3/uL (ref 0.0–0.2)
Basos: 1 %
Bilirubin Total: 0.6 mg/dL (ref 0.0–1.2)
Calcium: 9.7 mg/dL (ref 8.6–10.2)
Chloride: 103 mmol/L (ref 96–106)
Chol/HDL Ratio: 4.5 ratio (ref 0.0–5.0)
Cholesterol, Total: 213 mg/dL — ABNORMAL HIGH (ref 100–199)
Creatinine, Ser: 0.88 mg/dL (ref 0.76–1.27)
EOS (ABSOLUTE): 0.1 10*3/uL (ref 0.0–0.4)
Eos: 2 %
Estimated CHD Risk: 0.9 times avg. (ref 0.0–1.0)
Free Thyroxine Index: 1.6 (ref 1.2–4.9)
GFR calc Af Amer: 106 mL/min/{1.73_m2} (ref >59)
GFR calc non Af Amer: 91 mL/min/{1.73_m2} (ref >59)
GGT: 12 IU/L (ref 0–65)
Globulin, Total: 3 g/dL (ref 1.5–4.5)
Glucose: 96 mg/dL (ref 65–99)
HDL: 47 mg/dL (ref >39)
Hematocrit: 45.7 % (ref 37.5–51.0)
Hemoglobin: 15.4 g/dL (ref 13.0–17.7)
Immature Grans (Abs): 0 10*3/uL (ref 0.0–0.1)
Immature Granulocytes: 0 %
Iron: 93 ug/dL (ref 38–169)
LDH: 221 IU/L (ref 121–224)
LDL Chol Calc (NIH): 151 mg/dL — ABNORMAL HIGH (ref 0–99)
Lymphocytes Absolute: 1.3 10*3/uL (ref 0.7–3.1)
Lymphs: 26 %
MCH: 32 pg (ref 26.6–33.0)
MCHC: 33.7 g/dL (ref 31.5–35.7)
MCV: 95 fL (ref 79–97)
Monocytes Absolute: 0.5 10*3/uL (ref 0.1–0.9)
Monocytes: 10 %
Neutrophils Absolute: 2.9 10*3/uL (ref 1.4–7.0)
Neutrophils: 61 %
Phosphorus: 2.8 mg/dL (ref 2.8–4.1)
Platelets: 239 10*3/uL (ref 150–450)
Potassium: 4.4 mmol/L (ref 3.5–5.2)
Prostate Specific Ag, Serum: 0.3 ng/mL (ref 0.0–4.0)
RBC: 4.81 x10E6/uL (ref 4.14–5.80)
RDW: 12.2 % (ref 11.6–15.4)
Sodium: 138 mmol/L (ref 134–144)
T3 Uptake Ratio: 27 % (ref 24–39)
T4, Total: 6 ug/dL (ref 4.5–12.0)
TSH: 1.37 u[IU]/mL (ref 0.450–4.500)
Total Protein: 7.4 g/dL (ref 6.0–8.5)
Triglycerides: 83 mg/dL (ref 0–149)
Uric Acid: 4.5 mg/dL (ref 3.7–8.6)
VLDL Cholesterol Cal: 15 mg/dL (ref 5–40)
WBC: 4.8 10*3/uL (ref 3.4–10.8)

## 2019-07-24 ENCOUNTER — Encounter: Payer: Self-pay | Admitting: Physician Assistant

## 2019-07-24 ENCOUNTER — Other Ambulatory Visit: Payer: Self-pay

## 2019-07-24 ENCOUNTER — Ambulatory Visit: Payer: Self-pay | Admitting: Physician Assistant

## 2019-07-24 VITALS — BP 132/82 | HR 63 | Temp 98.2°F | Resp 12 | Wt 209.0 lb

## 2019-07-24 DIAGNOSIS — Z0000 Encounter for general adult medical examination without abnormal findings: Secondary | ICD-10-CM

## 2019-07-24 NOTE — Progress Notes (Signed)
   Subjective:Annual Physical    Patient ID: Rodney Webb, male    DOB: 1956/07/09, 63 y.o.   MRN: [id number]  HPI Patient present for annual physical exam. Voices concern for increased fatigue by late afternoon. Also state increased dry mouth and nasal congestion via C-PAP. Admit decrease use of C-Pap.   Review of Systems Sleep apnea and hyperlipidemia.    Objective:   Physical Exam HEENT: unremarkable Neck: Supple without adenopathy or bruits. Lungs: CTA Heart: RRR Abdomen: Normoactive BS and SNTTP. Cervical/Lumbar spine: No deformity. F/E ROM CNII-XII: grossly intact.      Assessment & Plan:  Well exam. Sleep apnea.  Advised to increase humidity in C-Pap machine. Advise cleaning C-Pap hose bi-weekly or purchase C-Pap cleaner. Use throat drop for dry mouth.  Follow up with [facility].

## 2019-07-25 ENCOUNTER — Encounter: Payer: Self-pay | Admitting: Registered Nurse

## 2019-07-25 ENCOUNTER — Other Ambulatory Visit: Payer: Self-pay | Admitting: Registered Nurse

## 2019-07-25 NOTE — Telephone Encounter (Signed)
Patient seen by [facility].  Has EUS this spring unable to view results in Epic for gastric nodule.  PMHx GERD.  Labs completed 07/18/2019 and saw [place] at [facility] 07/24/2019.   Refilled esomeprazole #90 RF0 electronic Rx to his pharmacy of choice.

## 2019-08-30 ENCOUNTER — Other Ambulatory Visit: Payer: Self-pay

## 2019-08-31 ENCOUNTER — Telehealth: Payer: Self-pay

## 2019-08-31 NOTE — Telephone Encounter (Signed)
Received PA from [facility] [place] ([place]) requesting prior authorization for Esomeprazole Magnesium 20 mg DR Caps.  08/31/2019 11:02 am Contacted [employer] at [phone] & spoke with [facility] in Customer Service. Esomeprazole Magnesium 20 mg Caps 1 cap po qd #90  PA approved for 36 months starting today (08/31/2019 - 08/30/2022) & pharmacy can go ahead & process refills as of today.  Faxed form to [facility] at [phone] notifying them that PA approved & they can process refills.  AMD

## 2019-10-24 ENCOUNTER — Ambulatory Visit: Payer: Self-pay | Admitting: Registered Nurse

## 2019-10-24 ENCOUNTER — Other Ambulatory Visit: Payer: Self-pay

## 2019-10-24 VITALS — BP 130/84 | HR 75 | Temp 98.6°F | Ht 74.0 in | Wt 218.6 lb

## 2019-10-24 DIAGNOSIS — M25531 Pain in right wrist: Secondary | ICD-10-CM

## 2019-10-24 DIAGNOSIS — M7751 Other enthesopathy of right foot: Secondary | ICD-10-CM

## 2019-10-24 DIAGNOSIS — I10 Essential (primary) hypertension: Secondary | ICD-10-CM

## 2019-10-24 DIAGNOSIS — H6122 Impacted cerumen, left ear: Secondary | ICD-10-CM

## 2019-10-24 MED ORDER — DICLOFENAC SODIUM 1 % EX GEL: 2.0000 g | Freq: Four times a day (QID) | CUTANEOUS | 1 refills | Status: AC

## 2019-10-24 MED ORDER — HYDROCHLOROTHIAZIDE 25 MG PO TABS: 12.5000 mg | ORAL_TABLET | Freq: Every day | ORAL | 1 refills | Status: DC

## 2019-10-24 NOTE — Patient Instructions (Signed)
Managing Your Hypertension Hypertension is commonly called high blood pressure. This is when the force of your blood pressing against the walls of your arteries is too strong. Arteries are blood vessels that carry blood from your heart throughout your body. Hypertension forces the heart to work harder to pump blood, and may cause the arteries to become narrow or stiff. Having untreated or uncontrolled hypertension can cause heart attack, stroke, kidney disease, and other problems. What are blood pressure readings? A blood pressure reading consists of a higher number over a lower number. Ideally, your blood pressure should be below 120/80. The first ("top") number is called the systolic pressure. It is a measure of the pressure in your arteries as your heart beats. The second ("bottom") number is called the diastolic pressure. It is a measure of the pressure in your arteries as the heart relaxes. What does my blood pressure reading mean? Blood pressure is classified into four stages. Based on your blood pressure reading, your health care provider may use the following stages to determine what type of treatment you need, if any. Systolic pressure and diastolic pressure are measured in a unit called mm Hg. Normal  Systolic pressure: below 120.  Diastolic pressure: below 80. Elevated  Systolic pressure: 120-129.  Diastolic pressure: below 80. Hypertension stage 1  Systolic pressure: 130-139.  Diastolic pressure: 80-89. Hypertension stage 2  Systolic pressure: 140 or above.  Diastolic pressure: 90 or above. What health risks are associated with hypertension? Managing your hypertension is an important responsibility. Uncontrolled hypertension can lead to:  A heart attack.  A stroke.  A weakened blood vessel (aneurysm).  Heart failure.  Kidney damage.  Eye damage.  Metabolic syndrome.  Memory and concentration problems. What changes can I make to manage my  hypertension? Hypertension can be managed by making lifestyle changes and possibly by taking medicines. Your health care provider will help you make a plan to bring your blood pressure within a normal range. Eating and drinking   Eat a diet that is high in fiber and potassium, and low in salt (sodium), added sugar, and fat. An example eating plan is called the DASH (Dietary Approaches to Stop Hypertension) diet. To eat this way: ? Eat plenty of fresh fruits and vegetables. Try to fill half of your plate at each meal with fruits and vegetables. ? Eat whole grains, such as whole wheat pasta, brown rice, or whole grain bread. Fill about one quarter of your plate with whole grains. ? Eat low-fat diary products. ? Avoid fatty cuts of meat, processed or cured meats, and poultry with skin. Fill about one quarter of your plate with lean proteins such as fish, chicken without skin, beans, eggs, and tofu. ? Avoid premade and processed foods. These tend to be higher in sodium, added sugar, and fat.  Reduce your daily sodium intake. Most people with hypertension should eat less than 1,500 mg of sodium a day.  Limit alcohol intake to no more than 1 drink a day for nonpregnant women and 2 drinks a day for men. One drink equals 12 oz of beer, 5 oz of wine, or 1 oz of hard liquor. Lifestyle  Work with your health care provider to maintain a healthy body weight, or to lose weight. Ask what an ideal weight is for you.  Get at least 30 minutes of exercise that causes your heart to beat faster (aerobic exercise) most days of the week. Activities may include walking, swimming, or biking.  Include exercise   to strengthen your muscles (resistance exercise), such as weight lifting, as part of your weekly exercise routine. Try to do these types of exercises for 30 minutes at least 3 days a week.  Do not use any products that contain nicotine or tobacco, such as cigarettes and e-cigarettes. If you need help quitting,  ask your health care provider.  Control any long-term (chronic) conditions you have, such as high cholesterol or diabetes. Monitoring  Monitor your blood pressure at home as told by your health care provider. Your personal target blood pressure may vary depending on your medical conditions, your age, and other factors.  Have your blood pressure checked regularly, as often as told by your health care provider. Working with your health care provider  Review all the medicines you take with your health care provider because there may be side effects or interactions.  Talk with your health care provider about your diet, exercise habits, and other lifestyle factors that may be contributing to hypertension.  Visit your health care provider regularly. Your health care provider can help you create and adjust your plan for managing hypertension. Will I need medicine to control my blood pressure? Your health care provider may prescribe medicine if lifestyle changes are not enough to get your blood pressure under control, and if:  Your systolic blood pressure is 130 or higher.  Your diastolic blood pressure is 80 or higher. Take medicines only as told by your health care provider. Follow the directions carefully. Blood pressure medicines must be taken as prescribed. The medicine does not work as well when you skip doses. Skipping doses also puts you at risk for problems. Contact a health care provider if:  You think you are having a reaction to medicines you have taken.  You have repeated (recurrent) headaches.  You feel dizzy.  You have swelling in your ankles.  You have trouble with your vision. Get help right away if:  You develop a severe headache or confusion.  You have unusual weakness or numbness, or you feel faint.  You have severe pain in your chest or abdomen.  You vomit repeatedly.  You have trouble breathing. Summary  Hypertension is when the force of blood pumping  through your arteries is too strong. If this condition is not controlled, it may put you at risk for serious complications.  Your personal target blood pressure may vary depending on your medical conditions, your age, and other factors. For most people, a normal blood pressure is less than 120/80.  Hypertension is managed by lifestyle changes, medicines, or both. Lifestyle changes include weight loss, eating a healthy, low-sodium diet, exercising more, and limiting alcohol. This information is not intended to replace advice given to you by your health care provider. Make sure you discuss any questions you have with your health care provider. Document Revised: 12/22/2018 Document Reviewed: 07/28/2016 Elsevier Patient Education  2020 [employer] Tommi Rumps Quervain's Tenosynovitis  De Quervain's tenosynovitis is a condition that causes inflammation of the tendon on the thumb side of the wrist. Tendons are cords of tissue that connect bones to muscles. The tendons in the hand pass through a tunnel called a sheath. A slippery layer of tissue (synovium) lets the tendons move smoothly in the sheath. With de Quervain's tenosynovitis, the sheath swells or thickens, causing friction and pain. The condition is also called de Quervain's disease and de Quervain's syndrome. It occurs most often in women who are 85-52 years old. What are the causes? The exact cause of  this condition is not known. It may be associated with overuse of the hand and wrist. What increases the risk? You are more likely to develop this condition if you:  Use your hands far more than normal, especially if you repeat certain movements that involve twisting your hand or using a tight grip.  Are pregnant.  Are a middle-aged woman.  Have rheumatoid arthritis.  Have diabetes. What are the signs or symptoms? The main symptom of this condition is pain on the thumb side of the wrist. The pain may get worse when you grasp something or turn  your wrist. Other symptoms may include:  Pain that extends up the forearm.  Swelling of your wrist and hand.  Trouble moving the thumb and wrist.  A sensation of snapping in the wrist.  A bump filled with fluid (cyst) in the area of the pain. How is this diagnosed? This condition may be diagnosed based on:  Your symptoms and medical history.  A physical exam. During the exam, your health care provider may do a simple test Lourena Simmonds test) that involves pulling your thumb and wrist to see if this causes pain. You may also need to have an X-ray. How is this treated? Treatment for this condition may include:  Avoiding any activity that causes pain and swelling.  Taking medicines. Anti-inflammatory medicines and corticosteroid injections may be used to reduce inflammation and relieve pain.  Wearing a splint.  Having surgery. This may be needed if other treatments do not work. Once the pain and swelling has gone down:  Physical therapy. This includes stretching and strengthening exercises.  Occupational therapy. This includes adjusting how you move your wrist. Follow these instructions at home: If you have a splint:  Wear the splint as told by your health care provider. Remove it only as told by your health care provider.  Loosen the splint if your fingers tingle, become numb, or turn cold and blue.  Keep the splint clean.  If the splint is not waterproof: ? Do not let it get wet. ? Cover it with a watertight covering when you take a bath or a shower. Managing pain, stiffness, and swelling   Avoid movements and activities that cause pain and swelling in the wrist area.  If directed, put ice on the painful area. This may be helpful after doing activities that involve the sore wrist. ? Put ice in a plastic bag. ? Place a towel between your skin and the bag. ? Leave the ice on for 20 minutes, 2-3 times a day.  Move your fingers often to avoid stiffness and to lessen  swelling.  Raise (elevate) the injured area above the level of your heart while you are sitting or lying down. General instructions  Return to your normal activities as told by your health care provider. Ask your health care provider what activities are safe for you.  Take over-the-counter and prescription medicines only as told by your health care provider.  Keep all follow-up visits as told by your health care provider. This is important. Contact a health care provider if:  Your pain medicine does not help.  Your pain gets worse.  You develop new symptoms. Summary  De Quervain's tenosynovitis is a condition that causes inflammation of the tendon on the thumb side of the wrist.  The condition occurs most often in women who are 83-8 years old.  The exact cause of this condition is not known. It may be associated with overuse of the hand  and wrist.  Treatment starts with avoiding activity that causes pain or swelling in the wrist area. Other treatment may include wearing a splint and taking medicine. Sometimes, surgery is needed. This information is not intended to replace advice given to you by your health care provider. Make sure you discuss any questions you have with your health care provider. Document Revised: 03/02/2018 Document Reviewed: 08/08/2017 Elsevier Patient Education  [place]. Bursitis  Bursitis is inflammation and irritation of a bursa, which is one of the small, fluid-filled sacs that cushion and protect the moving parts of your body. These sacs are located between bones and muscles, bones and muscle attachments, or bones and skin areas that are next to bones. A bursa protects those structures from the wear and tear that results from frequent movement. An inflamed bursa causes pain and swelling. Fluid may build up inside the sac. Bursitis is most common near joints, especially the knees, elbows, hips, and shoulders. What are the causes? This condition may  be caused by:  Injury from: ? A direct hit (blow), like falling on your knee or elbow. ? Overuse of a joint (repetitive stress).  Infection. This can happen if bacteria get into a bursa through a cut or scrape near a joint.  Diseases that cause joint inflammation, such as gout and rheumatoid arthritis. What increases the risk? You are more likely to develop this condition if you:  Have a job or hobby that involves a lot of repetitive stress on your joints.  Have a condition that weakens your body's defense system (immune system), such as diabetes, cancer, or HIV.  Do any of these often: ? Lift and reach overhead. ? Kneel or lean on hard surfaces. ? Run or walk. What are the signs or symptoms? The most common symptoms of this condition include:  Pain that gets worse when you move the affected body part or use it to support (bear) your body weight.  Inflammation.  Stiffness. Other symptoms include:  Redness.  Swelling.  Tenderness.  Warmth.  Pain that continues after rest.  Fever or chills. These may occur in bursitis that is caused by infection. How is this diagnosed? This condition may be diagnosed based on:  Your medical history and a physical exam.  Imaging tests, such as an MRI.  A procedure to drain fluid from the bursa with a needle (aspiration). The fluid may be checked for signs of infection or gout.  Blood tests to rule out other causes of inflammation. How is this treated? This condition can usually be treated at home with rest, ice, applying pressure (compression), and raising the body part that is affected (elevation). This is called RICE therapy. For mild bursitis, RICE therapy may be all you need. Other treatments may include:  NSAIDs to treat pain and inflammation.  Corticosteroid medicines to fight inflammation. These medicines may be injected into and around the area of bursitis.  Aspiration of fluid from the bursa to relieve pain and improve  movement.  Antibiotic medicine to treat an infected bursa.  A splint, brace, or walking aid, such as a cane.  Physical therapy if you continue to have pain or limited movement.  Surgery to remove a damaged or infected bursa. This may be needed if other treatments have not worked. Follow these instructions at home: Medicines  Take over-the-counter and prescription medicines only as told by your health care provider.  If you were prescribed an antibiotic medicine, take it as told by your health care  provider. Do not stop taking the antibiotic even if you start to feel better. General instructions   Rest the affected area as told by your health care provider. ? If possible, raise (elevate) the affected area above the level of your heart while you are sitting or lying down. ? Avoid activities that make pain worse.  Use splints, braces, pads, or walking aids as told by your health care provider.  If directed, put ice on the affected area: ? If you have a removable splint or brace, remove it as told by your health care provider. ? Put ice in a plastic bag. ? Place a towel between your skin and the bag or between your splint or brace and the bag. ? Leave the ice on for 20 minutes, 2-3 times a day.  Keep all follow-up visits as told by your health care provider. This is important. Preventing future episodes Take actions to help prevent future episodes of bursitis.  Wear knee pads if you kneel often.  Wear sturdy running or walking shoes that fit you well.  Take breaks regularly from repetitive activity.  Warm up by stretching before doing any activity that takes a lot of effort.  Maintain a healthy weight or lose weight as recommended by your health care provider. If you need help doing this, ask your health care provider.  Exercise regularly. Start any new physical activity gradually. Contact a health care provider if you:  Have a fever.  Have chills.  Have bursitis that  is not getting better with treatment or home care. Summary  Bursitis is inflammation and irritation of a bursa, which is one of the small, fluid-filled sacs that cushion and protect the moving parts of your body.  An inflamed bursa causes pain and swelling.  Bursitis is commonly diagnosed with a physical exam, but other tests are sometimes needed.  This condition can usually be treated at home with rest, ice, applying pressure (compression), and raising the body part that is affected (elevation). This is called RICE therapy. This information is not intended to replace advice given to you by your health care provider. Make sure you discuss any questions you have with your health care provider. Document Revised: 08/12/2017 Document Reviewed: 07/15/2017 Elsevier Patient Education  2020 [employer].

## 2019-10-24 NOTE — Progress Notes (Signed)
Subjective:    Patient ID: Rodney Webb, male    DOB: 1956-08-29, 64 y.o.   MRN: [id number]  63y/o caucasian male established patient here to follow up right foot/ankle pain last seen by Dr Dorris Fetch May 2020 xray completed showed phalanx avulsion fracture 1&2 and Dr Dorris Fetch suspected bone contusion from electric drill being dropped 6 feet onto his right foot he was wearing steel toe boots.  Patient reported he bought new boots and that helped some recently but notices when walking across floor barefoot he hears a clicking noise and some pain bottom of foot.  Xray noted calcaneal spur also.  Patient wondering if he can try voltaren gel for foot pain he bought over the counter and it said to discuss with provider before use.  He has also been having right wrist pain installing screws in wall put on compression today and helping with pain but wondering if he can apply diclofenac gel to that area also.  Patient also wearing ear plugs to church otherwise music too loud and thinks wax blocking up ear canal again can hear water and hearing acuity decreased.  Patient also needs refill on hydrochlorothiazide 1/2 tab has been working much better for him; he would like 90 day supply.  Not having to get up at night to urinate now.     Review of Systems  Constitutional: Positive for activity change. Negative for appetite change, chills, diaphoresis, fatigue and fever.  HENT: Negative for trouble swallowing and voice change.   Eyes: Negative for photophobia and visual disturbance.  Respiratory: Negative for cough, shortness of breath, wheezing and stridor.   Cardiovascular: Negative for chest pain, palpitations and leg swelling.  Gastrointestinal: Negative for abdominal pain, diarrhea, nausea and vomiting.  Endocrine: Negative for cold intolerance and heat intolerance.  Genitourinary: Negative for difficulty urinating.  Musculoskeletal: Positive for arthralgias, joint swelling and myalgias. Negative  for back pain, gait problem, neck pain and neck stiffness.  Skin: Negative for color change, pallor, rash and wound.  Allergic/Immunologic: Negative for food allergies.  Neurological: Negative for dizziness, tremors, seizures, syncope, facial asymmetry, speech difficulty, weakness, light-headedness, numbness and headaches.  Hematological: Negative for adenopathy. Does not bruise/bleed easily.  Psychiatric/Behavioral: Negative for agitation, confusion and sleep disturbance.       Objective:   Physical Exam Vitals and nursing note reviewed.  Constitutional:      General: He is awake. He is not in acute distress.    Appearance: Normal appearance. He is well-developed and well-groomed. He is not ill-appearing, toxic-appearing or diaphoretic.  HENT:     Head: Normocephalic and atraumatic.     Jaw: There is normal jaw occlusion. No trismus.     Salivary Glands: Right salivary gland is not diffusely enlarged or tender. Left salivary gland is not diffusely enlarged or tender.     Right Ear: Hearing, ear canal and external ear normal. A middle ear effusion is present. There is no impacted cerumen.     Left Ear: Hearing, ear canal and external ear normal. A middle ear effusion is present. There is impacted cerumen.     Ears:     Comments: Left auditory canal 100% occlusion cerumen; right auditory canal noted dry flaky skin 6 oclock; used ear curettage and waterpik to remove 100% cerumen left canal  6 oclock position noted epithelial layer disruption after cerumen removed no bleeding patient reported can hear better; bilateral TMs intact without erythema    Nose: Nose normal. No nasal deformity, septal deviation,  signs of injury, laceration, nasal tenderness, mucosal edema, congestion or rhinorrhea.     Right Sinus: No maxillary sinus tenderness or frontal sinus tenderness.     Left Sinus: No maxillary sinus tenderness or frontal sinus tenderness.     Mouth/Throat:     Lips: Pink. No lesions.      Mouth: Mucous membranes are moist. Mucous membranes are not pale, not dry and not cyanotic. No lacerations, oral lesions or angioedema.     Dentition: No gingival swelling, dental abscesses or gum lesions.     Tongue: No lesions. Tongue does not deviate from midline.     Palate: No mass and lesions.     Pharynx: Oropharynx is clear. Uvula midline. No pharyngeal swelling, oropharyngeal exudate, posterior oropharyngeal erythema or uvula swelling.     Tonsils: No tonsillar exudate or tonsillar abscesses.     Comments: Bilateral allergic shiners;  Eyes:     General: Lids are normal. Vision grossly intact. Gaze aligned appropriately. Allergic shiner present. No visual field deficit or scleral icterus.       Right eye: No foreign body, discharge or hordeolum.        Left eye: No foreign body, discharge or hordeolum.     Extraocular Movements: Extraocular movements intact.     Right eye: Normal extraocular motion and no nystagmus.     Left eye: Normal extraocular motion and no nystagmus.     Conjunctiva/sclera: Conjunctivae normal.     Right eye: Right conjunctiva is not injected. No chemosis, exudate or hemorrhage.    Left eye: Left conjunctiva is not injected. No chemosis, exudate or hemorrhage.    Pupils: Pupils are equal, round, and reactive to light. Pupils are equal.     Right eye: Pupil is round and reactive.     Left eye: Pupil is round and reactive.  Neck:     Thyroid: No thyroid mass, thyromegaly or thyroid tenderness.     Trachea: Trachea and phonation normal. No tracheal tenderness or tracheal deviation.  Cardiovascular:     Rate and Rhythm: Normal rate and regular rhythm.     Chest Wall: PMI is not displaced.     Pulses: Normal pulses.          Radial pulses are 2+ on the right side and 2+ on the left side.       Dorsalis pedis pulses are 2+ on the right side and 2+ on the left side.       Posterior tibial pulses are 2+ on the right side and 2+ on the left side.     Heart sounds:  Normal heart sounds, S1 normal and S2 normal. No murmur. No friction rub. No gallop.      Comments: Right ankle 0-1+/4 nonpitting Pulmonary:     Effort: Pulmonary effort is normal. No respiratory distress.     Breath sounds: Normal breath sounds and air entry. No stridor, decreased air movement or transmitted upper airway sounds. No decreased breath sounds, wheezing, rhonchi or rales.     Comments: Wearing cloth mask due to covid 19 pandemic; spoke full sentences without difficulty; no cough observed in exam room Abdominal:     General: Abdomen is flat. There is no distension.     Palpations: Abdomen is soft.  Musculoskeletal:        General: Swelling, tenderness and signs of injury present. No deformity. Normal range of motion.     Right elbow: Normal.     Left elbow: Normal.  Right forearm: Normal.     Left forearm: Normal.     Right wrist: Normal. No swelling, deformity, effusion, lacerations, bony tenderness, snuff box tenderness or crepitus. Normal range of motion. Normal pulse.     Left wrist: Normal. No swelling, deformity, effusion, lacerations, bony tenderness, snuff box tenderness or crepitus. Normal range of motion. Normal pulse.     Right hand: Normal. No swelling, deformity, lacerations, tenderness or bony tenderness. Normal range of motion. Normal strength. Normal sensation. Normal capillary refill. Normal pulse.     Left hand: Normal. No swelling, deformity, lacerations, tenderness or bony tenderness. Normal range of motion. Normal strength. Normal sensation. Normal capillary refill. Normal pulse.       Arms:     Cervical back: Normal, normal range of motion and neck supple. No edema, erythema, signs of trauma, rigidity, torticollis or crepitus. No pain with movement, spinous process tenderness or muscular tenderness. Normal range of motion.     Thoracic back: Normal.     Lumbar back: Normal.     Right lower leg: No swelling, deformity, lacerations, tenderness or bony  tenderness. No edema.     Left lower leg: No swelling, deformity, lacerations, tenderness or bony tenderness. No edema.     Right ankle: Swelling present. No deformity, ecchymosis or lacerations. Tenderness present. No lateral malleolus, medial malleolus, base of 5th metatarsal or proximal fibula tenderness. Normal range of motion. Normal pulse.     Right Achilles Tendon: Normal. No tenderness or defects.     Left ankle: Normal. No swelling, deformity, ecchymosis or lacerations. No tenderness. No lateral malleolus, medial malleolus, base of 5th metatarsal or proximal fibula tenderness. Normal range of motion. Normal pulse.     Left Achilles Tendon: Normal. No tenderness or defects.     Right foot: Normal range of motion and normal capillary refill. Swelling and tenderness present. No deformity, bunion, Charcot foot, foot drop, prominent metatarsal heads, laceration, bony tenderness or crepitus. Normal pulse.     Left foot: Normal. Normal range of motion and normal capillary refill. No swelling, deformity, bunion, Charcot foot, foot drop, prominent metatarsal heads, laceration, tenderness, bony tenderness or crepitus. Normal pulse.       Legs:     Comments: TTP soft tissue distal lateral malleolus and soft tissue overlying 4/5th MTP right; bursa inflamed right ankle/dorsum  Lymphadenopathy:     Head:     Right side of head: No submental, submandibular, tonsillar, preauricular, posterior auricular or occipital adenopathy.     Left side of head: No submental, submandibular, tonsillar, preauricular, posterior auricular or occipital adenopathy.     Cervical: No cervical adenopathy.     Right cervical: No superficial, deep or posterior cervical adenopathy.    Left cervical: No superficial, deep or posterior cervical adenopathy.  Skin:    General: Skin is warm and dry.     Capillary Refill: Capillary refill takes less than 2 seconds.     Coloration: Skin is not ashen, cyanotic, jaundiced, mottled, pale  or sallow.     Findings: No abrasion, abscess, acne, bruising, burn, ecchymosis, erythema, signs of injury, laceration, lesion, petechiae, rash or wound.     Nails: There is no clubbing.  Neurological:     General: No focal deficit present.     Mental Status: He is alert and oriented to person, place, and time. Mental status is at baseline.     GCS: GCS eye subscore is 4. GCS verbal subscore is 5. GCS motor subscore is 6.  Cranial Nerves: Cranial nerves are intact. No cranial nerve deficit, dysarthria or facial asymmetry.     Sensory: Sensation is intact. No sensory deficit.     Motor: Motor function is intact. No weakness, tremor, atrophy, abnormal muscle tone or seizure activity.     Coordination: Coordination is intact. Coordination normal.     Gait: Gait is intact. Gait normal.     Comments: Bilateral hand grasp equal 5/5; upper/lower extremity strength equal 5/5; on/off exam table and in/out of chair without difficulty; gait sure and steady in hallway  Psychiatric:        Attention and Perception: Attention and perception normal.        Mood and Affect: Mood and affect normal.        Speech: Speech normal.        Behavior: Behavior normal. Behavior is cooperative.        Thought Content: Thought content normal.        Cognition and Memory: Cognition and memory normal.        Judgment: Judgment normal.           Assessment & Plan:  A-left cerumen impaction recurrent, right foot/ankle pain subsequent visit, acute right wrist pain, essential hypertension and BMI 28  P-Patient having to use ear plugs in ear at church.  Patient reported slight discomfort external ear canal after procedure curretage extraction left ear, minor redness noted 6 oclock and epithelial layer disrupted left and TMs intact without erythema.  Patient reported sounds are louder now.  Try 5-10gtts mineral oil external auditory canal daily and may put cotton ball at external ear for 20 minutes after drops  instilled.  When in clinic have provider check ears to see if buildup with this method again.  Discussed purpose of earwax with patient.  Avoid cotton applicator (Q-tip) use in ears.  exitcare handout printed and given on cerumen impaction  Patient verbalized understanding, agreed with plan of care and had no further questions at this time.      2 months after drill fell on foot saw Dr Dorris Fetch foot xray completed no ankle fracture diagnosed with bone contusion.  New boots recently and rubbing a little ankle/loosened up laces but has noted swelling dorsum right foot and ankle but pain slightly improved with new work boots worsens with walking barefoot in house.  Discussed bursitis with patient going to try voltaren gel OTC 2g application BID instead of oral NSAIDS x 2 weeks.  Exitcare handout on bursitis printed and given to patient and discussed cryotherapy 15 minutes QID prn pain/swelling also.  Follow up in 2 weeks if no improvement with plan of care.  Patient verbalized understanding information/instructions, agreed with plan of care and had no further questions at this time.  Discussed repetitive motion putting up walls/screws dequervains tenosynovitis may continue cobain compression/support around right wrist. try voltaren gel 2g application BID OTC instead of oral NSAIDS x 2 weeks.  Exitcare handout on dequervains tenosynovitis  printed and given to patient and discussed cryotherapy 15 minutes QID prn pain/swelling also.  Follow up in 2 weeks if no improvement with plan of care.  Patient verbalized understanding information/instructions, agreed with plan of care and had no further questions at this time.  BP 130/84 acute pain right foot today.  Labs reviewed electrolytes/kidney/liver function stable Nov 2020 labs.  Refilled hydrochlorothiazide 25mg  take 1/2-1 tablet po daily #45 RF1 electronic Rx to his pharmacy of choice.  Annual visit due Nov 2021 with labs.  Weight  loss to BMI less than 25  recommended.  Patient physically active/continue, avoid weight gain. Exitcare handout printed and given on managing hypertension.  Follow up in 2 weeks if no improvement with plan of care.  Patient verbalized understanding information/instructions, agreed with plan of care and had no further questions at this time.

## 2019-11-08 DIAGNOSIS — Z03818 Encounter for observation for suspected exposure to other biological agents ruled out: Secondary | ICD-10-CM | POA: Diagnosis not present

## 2019-11-08 DIAGNOSIS — Z1159 Encounter for screening for other viral diseases: Secondary | ICD-10-CM | POA: Diagnosis not present

## 2019-11-08 DIAGNOSIS — R05 Cough: Secondary | ICD-10-CM | POA: Diagnosis not present

## 2019-11-15 ENCOUNTER — Ambulatory Visit
Admission: EM | Admit: 2019-11-15 | Discharge: 2019-11-15 | Disposition: A | Discharge status: Home / Self Care | Payer: 59 | Attending: Emergency Medicine | Admitting: Emergency Medicine

## 2019-11-15 ENCOUNTER — Other Ambulatory Visit: Payer: Self-pay

## 2019-11-15 DIAGNOSIS — Z0189 Encounter for other specified special examinations: Secondary | ICD-10-CM

## 2019-11-15 DIAGNOSIS — R509 Fever, unspecified: Secondary | ICD-10-CM | POA: Diagnosis not present

## 2019-11-15 DIAGNOSIS — B349 Viral infection, unspecified: Secondary | ICD-10-CM

## 2019-11-15 DIAGNOSIS — R0981 Nasal congestion: Secondary | ICD-10-CM

## 2019-11-15 DIAGNOSIS — I10 Essential (primary) hypertension: Secondary | ICD-10-CM

## 2019-11-15 DIAGNOSIS — R519 Headache, unspecified: Secondary | ICD-10-CM | POA: Diagnosis not present

## 2019-11-15 DIAGNOSIS — R197 Diarrhea, unspecified: Secondary | ICD-10-CM | POA: Diagnosis not present

## 2019-11-15 DIAGNOSIS — J22 Unspecified acute lower respiratory infection: Secondary | ICD-10-CM

## 2019-11-15 LAB — POCT INFLUENZA A/B
Influenza A, POC: NEGATIVE
Influenza B, POC: NEGATIVE

## 2019-11-15 LAB — POC SARS CORONAVIRUS 2 AG -  ED: SARS Coronavirus 2 Ag: NEGATIVE

## 2019-11-15 MED ORDER — AZITHROMYCIN 250 MG PO TABS: 250.0000 mg | ORAL_TABLET | Freq: Every day | ORAL | 0 refills | Status: DC

## 2019-11-15 NOTE — Discharge Instructions (Addendum)
Take the Zithromax as directed.  Follow up with your primary care provider or come back here for a recheck of your lungs in 4 days.  Your rapid flu is negative.    Your rapid COVID test is negative; the send-out test is pending.  You should self quarantine until your test result is back and is negative.    Go to the emergency department if you develop high fever, shortness of breath, severe diarrhea, or other concerning symptoms.    Your blood pressure is elevated today at 160/107 and repeated at 159/93.  Please have this rechecked by your primary care provider in 1-2 weeks.

## 2019-11-15 NOTE — ED Triage Notes (Signed)
Patient states that he has been having a headache, diarrhea and nasal congestion and fever since yesterday. Reports that his wife tested positive for Covid on Monday. Reports that he was tested last week for Covid when his cough started and was negative.

## 2019-11-15 NOTE — ED Provider Notes (Signed)
Renaldo Fiddler    CSN: [id number] Arrival date & time: 11/15/19  1518      History   Chief Complaint Chief Complaint  Patient presents with  . Headache    HPI Rodney Webb is a 64 y.o. male.   Patient presents with loss of taste/smell, headache, diarrhea, congestion, fever, and nonproductive cough since yesterday.  Tmax 101.5.  He reports  3 episodes of diarrhea today.  Patient states he is eating/drinking without difficulty.  His wife tested COVID positive on 11/12/2019.  Patient states he had a COVID test last week which was negative.  He denies sore throat, shortness of breath, vomiting, rash, or other symptoms.    The history is provided by the patient.    Past Medical History:  Diagnosis Date  . Arthritis    wrists  . Esophageal polyp   . GERD (gastroesophageal reflux disease)   . Hard of hearing    both ears  . Hyperlipemia   . Hypertension   . Insomnia   . NAFLD (nonalcoholic fatty liver disease)   . Overweight   . Sleep apnea    uses CPAP  . Vertigo    no episodes for 3-4 months    Patient Active Problem List   Diagnosis Date Noted  . Leg cramps 04/25/2019  . NAFL (nonalcoholic fatty liver) 04/25/2019  . Other hyperlipidemia 04/25/2019  . Overweight (BMI 25.0-29.9) 04/25/2019  . Obstructive sleep apnea syndrome 04/25/2019  . Abdominal pain, epigastric   . Other diseases of stomach and duodenum   . Polyp, stomach   . Gastro-esophageal reflux disease without esophagitis 07/03/2015  . H/O respiratory system disease 07/03/2015  . Essential hypertension 07/03/2015    Past Surgical History:  Procedure Laterality Date  . COLONOSCOPY WITH PROPOFOL N/A 12/25/2015   Procedure: COLONOSCOPY WITH PROPOFOL;  Surgeon: Wallace Cullens, MD;  Location: [facility] ENDOSCOPY;  Service: Gastroenterology;  Laterality: N/A;  . ESOPHAGOGASTRODUODENOSCOPY (EGD) WITH PROPOFOL N/A 12/25/2015   Procedure: ESOPHAGOGASTRODUODENOSCOPY (EGD) WITH PROPOFOL;  Surgeon: Wallace Cullens, MD;   Location: [facility] ENDOSCOPY;  Service: Gastroenterology;  Laterality: N/A;  . ESOPHAGOGASTRODUODENOSCOPY (EGD) WITH PROPOFOL N/A 10/06/2018   Procedure: ESOPHAGOGASTRODUODENOSCOPY (EGD) WITH PROPOFOL;  Surgeon: Midge Minium, MD;  Location: [facility];  Service: Endoscopy;  Laterality: N/A;  sleep apnea  . POLYPECTOMY  10/06/2018   Procedure: POLYPECTOMY;  Surgeon: Midge Minium, MD;  Location: [facility];  Service: Endoscopy;;  gastroesophageal nodule with polyp  . TONSILLECTOMY         Home Medications    Prior to Admission medications   Medication Sig Start Date End Date Taking? Authorizing Provider  ELDERBERRY PO Take by mouth.   Yes [provider]  esomeprazole (NEXIUM) 20 MG capsule TAKE ONE CAPSULE BY MOUTH EVERY MORNING 07/25/19  Yes Betancourt, Tina A, NP  fexofenadine-pseudoephedrine (ALLEGRA-D) 60-120 MG 12 hr tablet Take 1 tablet by mouth daily.    Yes [provider]  fluticasone (FLONASE) 50 MCG/ACT nasal spray Place 2 sprays into both nostrils daily as needed.    Yes [provider]  Glucosamine-Chondroit-Vit C-Mn (GLUCOSAMINE CHONDR 500 COMPLEX PO) Take by mouth.   Yes [provider]  hydrochlorothiazide (HYDRODIURIL) 25 MG tablet Take 0.5-1 tablets (12.5-25 mg total) by mouth daily. 10/24/19 01/22/20 Yes Betancourt, Jarold Song, NP  latanoprost (XALATAN) 0.005 % ophthalmic solution INT 1 GTT IN OU ATN 04/04/19  Yes [provider]  losartan (COZAAR) 50 MG tablet Take 50 mg by mouth 2 (  two) times daily.    Yes [provider]  Multiple Vitamin (MULTIVITAMIN) tablet Take 1 tablet by mouth daily.   Yes [provider]  azithromycin (ZITHROMAX) 250 MG tablet Take 1 tablet (250 mg total) by mouth daily. Take first 2 tablets together, then 1 every day until finished. 11/15/19   Mickie Bail, NP    Family History Family History  Problem Relation Age of Onset  . Diabetes Mother   . Stroke Father     Social  History Social History   Tobacco Use  . Smoking status: Never Smoker  . Smokeless tobacco: Former Neurosurgeon    Types: Chew  Substance Use Topics  . Alcohol use: No    Alcohol/week: 0.0 standard drinks  . Drug use: No     Allergies   Sulfa antibiotics and Penicillins   Review of Systems Review of Systems  Constitutional: Positive for fever. Negative for chills.  HENT: Positive for congestion. Negative for ear pain and sore throat.   Eyes: Negative for pain and visual disturbance.  Respiratory: Negative for cough and shortness of breath.   Cardiovascular: Negative for chest pain and palpitations.  Gastrointestinal: Positive for diarrhea. Negative for abdominal pain, nausea and vomiting.  Genitourinary: Negative for dysuria and hematuria.  Musculoskeletal: Negative for arthralgias and back pain.  Skin: Negative for color change and rash.  Neurological: Positive for headaches. Negative for seizures and syncope.  All other systems reviewed and are negative.    Physical Exam Triage Vital Signs ED Triage Vitals  Enc Vitals Group     BP      Pulse      Resp      Temp      Temp src      SpO2      Weight      Height      Head Circumference      Peak Flow      Pain Score      Pain Loc      Pain Edu?      Excl. in GC?    No data found.  Updated Vital Signs BP (!) 159/93   Pulse 89   Temp 99.7 F (37.6 C) (Oral)   Resp 18   Ht 6\' 2"  (1.88 m)   Wt 217 lb (98.4 kg)   SpO2 95%   BMI 27.86 kg/m   Visual Acuity Right Eye Distance:   Left Eye Distance:   Bilateral Distance:    Right Eye Near:   Left Eye Near:    Bilateral Near:     Physical Exam Vitals and nursing note reviewed.  Constitutional:      General: He is not in acute distress.    Appearance: He is well-developed.  HENT:     Head: Normocephalic and atraumatic.     Right Ear: Tympanic membrane normal.     Left Ear: Tympanic membrane normal.     Nose: Nose normal.     Mouth/Throat:     Mouth:  Mucous membranes are moist.     Pharynx: Oropharynx is clear.  Eyes:     Conjunctiva/sclera: Conjunctivae normal.  Cardiovascular:     Rate and Rhythm: Normal rate and regular rhythm.     Heart sounds: No murmur.  Pulmonary:     Effort: Pulmonary effort is normal. No respiratory distress.     Breath sounds: Rhonchi present. No wheezing.     Comments: Scattered rhonchi in bilateral lower lungs.  Abdominal:  General: Bowel sounds are normal.     Palpations: Abdomen is soft.     Tenderness: There is no abdominal tenderness. There is no guarding or rebound.  Musculoskeletal:     Cervical back: Neck supple.  Skin:    General: Skin is warm and dry.     Findings: No rash.  Neurological:     General: No focal deficit present.     Mental Status: He is alert and oriented to person, place, and time.  Psychiatric:        Mood and Affect: Mood normal.        Behavior: Behavior normal.      UC Treatments / Results  Labs (all labs ordered are listed, but only abnormal results are displayed) Labs Reviewed  POCT INFLUENZA A/B - Abnormal  NOVEL CORONAVIRUS, NAA  POC SARS CORONAVIRUS 2 AG -  ED    EKG   Radiology No results found.  Procedures Procedures (including critical care time)  Medications Ordered in UC Medications - No data to display  Initial Impression / Assessment and Plan / UC Course  I have reviewed the triage vital signs and the nursing notes.  Pertinent labs & imaging results that were available during my care of the patient were reviewed by me and considered in my medical decision making (see chart for details).    Lower respiratory infection. Elevated blood pressure with known HTN.  Rapid flu negative.  Rapid Covid negative; PCR Covid pending.  Treating with Zithromax.  Instructed patient to follow-up with his PCP or come back here for a recheck of his lungs in 4 days.  Instructed patient to go to the ED if he develops high fever, shortness of breath, severe  diarrhea, or other concerning symptoms.  Discussed with patient that his blood pressure is elevated today and needs to be rechecked by his PCP in 1 to 2 weeks.  Patient agrees to plan of care.     Final Clinical Impressions(s) / UC Diagnoses   Final diagnoses:  Patient request for diagnostic testing  Lower respiratory infection  Elevated blood pressure reading in office with diagnosis of hypertension     Discharge Instructions     Take the Zithromax as directed.  Follow up with your primary care provider or come back here for a recheck of your lungs in 4 days.  Your rapid flu is negative.    Your rapid COVID test is negative; the send-out test is pending.  You should self quarantine until your test result is back and is negative.    Go to the emergency department if you develop high fever, shortness of breath, severe diarrhea, or other concerning symptoms.    Your blood pressure is elevated today at 160/107 and repeated at 159/93.  Please have this rechecked by your primary care provider in 1-2 weeks.           ED Prescriptions    Medication Sig Dispense Auth. Provider   azithromycin (ZITHROMAX) 250 MG tablet Take 1 tablet (250 mg total) by mouth daily. Take first 2 tablets together, then 1 every day until finished. 6 tablet Sharion Balloon, NP     PDMP not reviewed this encounter.   Sharion Balloon, NP 11/15/19 1624

## 2019-11-16 LAB — NOVEL CORONAVIRUS, NAA: SARS-CoV-2, NAA: NOT DETECTED

## 2019-11-19 ENCOUNTER — Ambulatory Visit
Admission: EM | Admit: 2019-11-19 | Discharge: 2019-11-19 | Disposition: A | Discharge status: Home / Self Care | Payer: 59 | Attending: Emergency Medicine | Admitting: Emergency Medicine

## 2019-11-19 ENCOUNTER — Ambulatory Visit
Admission: RE | Admit: 2019-11-19 | Discharge: 2019-11-19 | Disposition: A | Discharge status: Home / Self Care | Payer: 59 | Attending: Diagnostic Radiology | Admitting: Diagnostic Radiology

## 2019-11-19 ENCOUNTER — Other Ambulatory Visit: Payer: Self-pay

## 2019-11-19 ENCOUNTER — Ambulatory Visit
Admission: RE | Admit: 2019-11-19 | Discharge: 2019-11-19 | Disposition: A | Discharge status: Home / Self Care | Payer: 59 | Source: Ambulatory Visit | Attending: Emergency Medicine | Admitting: Emergency Medicine

## 2019-11-19 ENCOUNTER — Encounter: Payer: Self-pay | Admitting: Emergency Medicine

## 2019-11-19 DIAGNOSIS — R438 Other disturbances of smell and taste: Secondary | ICD-10-CM | POA: Insufficient documentation

## 2019-11-19 DIAGNOSIS — I10 Essential (primary) hypertension: Secondary | ICD-10-CM | POA: Insufficient documentation

## 2019-11-19 DIAGNOSIS — R05 Cough: Secondary | ICD-10-CM | POA: Insufficient documentation

## 2019-11-19 DIAGNOSIS — R509 Fever, unspecified: Secondary | ICD-10-CM | POA: Diagnosis not present

## 2019-11-19 DIAGNOSIS — R197 Diarrhea, unspecified: Secondary | ICD-10-CM | POA: Diagnosis not present

## 2019-11-19 DIAGNOSIS — J22 Unspecified acute lower respiratory infection: Secondary | ICD-10-CM

## 2019-11-19 DIAGNOSIS — R0989 Other specified symptoms and signs involving the circulatory and respiratory systems: Secondary | ICD-10-CM | POA: Diagnosis not present

## 2019-11-19 MED ORDER — DOXYCYCLINE HYCLATE 100 MG PO CAPS: 100.0000 mg | ORAL_CAPSULE | Freq: Two times a day (BID) | ORAL | 0 refills | Status: DC

## 2019-11-19 NOTE — ED Provider Notes (Signed)
Rodney Webb    CSN: [id number] Arrival date & time: 11/19/19  1009      History   Chief Complaint Chief Complaint  Patient presents with  . Listen to lungs    HPI Rodney Webb is a 64 y.o. male.   Patient presents with request for a recheck of his lungs.  He was seen here on 11/15/2019 and diagnosed with a lower respiratory infection; He was treated with Zithromax; His PCR COVID was negative.  Patient states he is feeling worse; he reports fatigue, congestion, nonproductive cough.  He denies fever, shortness of breath, diarrhea, or other symptoms.    The history is provided by the patient.    Past Medical History:  Diagnosis Date  . Arthritis    wrists  . Esophageal polyp   . GERD (gastroesophageal reflux disease)   . Hard of hearing    both ears  . Hyperlipemia   . Hypertension   . Insomnia   . NAFLD (nonalcoholic fatty liver disease)   . Overweight   . Sleep apnea    uses CPAP  . Vertigo    no episodes for 3-4 months    Patient Active Problem List   Diagnosis Date Noted  . Leg cramps 04/25/2019  . NAFL (nonalcoholic fatty liver) 04/25/2019  . Other hyperlipidemia 04/25/2019  . Overweight (BMI 25.0-29.9) 04/25/2019  . Obstructive sleep apnea syndrome 04/25/2019  . Abdominal pain, epigastric   . Other diseases of stomach and duodenum   . Polyp, stomach   . Gastro-esophageal reflux disease without esophagitis 07/03/2015  . H/O respiratory system disease 07/03/2015  . Essential hypertension 07/03/2015    Past Surgical History:  Procedure Laterality Date  . COLONOSCOPY WITH PROPOFOL N/A 12/25/2015   Procedure: COLONOSCOPY WITH PROPOFOL;  Surgeon: Wallace Cullens, MD;  Location: [facility] ENDOSCOPY;  Service: Gastroenterology;  Laterality: N/A;  . ESOPHAGOGASTRODUODENOSCOPY (EGD) WITH PROPOFOL N/A 12/25/2015   Procedure: ESOPHAGOGASTRODUODENOSCOPY (EGD) WITH PROPOFOL;  Surgeon: Wallace Cullens, MD;  Location: [facility];  Service: Gastroenterology;  Laterality:  N/A;  . ESOPHAGOGASTRODUODENOSCOPY (EGD) WITH PROPOFOL N/A 10/06/2018   Procedure: ESOPHAGOGASTRODUODENOSCOPY (EGD) WITH PROPOFOL;  Surgeon: Midge Minium, MD;  Location: [facility];  Service: Endoscopy;  Laterality: N/A;  sleep apnea  . POLYPECTOMY  10/06/2018   Procedure: POLYPECTOMY;  Surgeon: Midge Minium, MD;  Location: [facility];  Service: Endoscopy;;  gastroesophageal nodule with polyp  . TONSILLECTOMY         Home Medications    Prior to Admission medications   Medication Sig Start Date End Date Taking? Authorizing Provider  azithromycin (ZITHROMAX) 250 MG tablet Take 1 tablet (250 mg total) by mouth daily. Take first 2 tablets together, then 1 every day until finished. 11/15/19   Mickie Bail, NP  doxycycline (VIBRAMYCIN) 100 MG capsule Take 1 capsule (100 mg total) by mouth 2 (two) times daily. 11/19/19   Mickie Bail, NP  ELDERBERRY PO Take by mouth.    [provider]  esomeprazole (NEXIUM) 20 MG capsule TAKE ONE CAPSULE BY MOUTH EVERY MORNING 07/25/19   Betancourt, Inetta Fermo A, NP  fexofenadine-pseudoephedrine (ALLEGRA-D) 60-120 MG 12 hr tablet Take 1 tablet by mouth daily.     [provider]  fluticasone (FLONASE) 50 MCG/ACT nasal spray Place 2 sprays into both nostrils daily as needed.     [provider]  Glucosamine-Chondroit-Vit C-Mn (GLUCOSAMINE CHONDR 500 COMPLEX PO) Take by mouth.    [provider]  hydrochlorothiazide (HYDRODIURIL) 25  MG tablet Take 0.5-1 tablets (12.5-25 mg total) by mouth daily. 10/24/19 01/22/20  Betancourt, Aura Fey, NP  latanoprost (XALATAN) 0.005 % ophthalmic solution INT 1 GTT IN OU ATN 04/04/19   [provider]  losartan (COZAAR) 50 MG tablet Take 50 mg by mouth 2 (two) times daily.     [provider]  Multiple Vitamin (MULTIVITAMIN) tablet Take 1 tablet by mouth daily.    [provider]    Family History Family History  Problem Relation Age of Onset  . Diabetes Mother    . Stroke Father     Social History Social History   Tobacco Use  . Smoking status: Never Smoker  . Smokeless tobacco: Former Systems developer    Types: Chew  Substance Use Topics  . Alcohol use: No    Alcohol/week: 0.0 standard drinks  . Drug use: No     Allergies   Sulfa antibiotics and Penicillins   Review of Systems Review of Systems  Constitutional: Positive for fatigue. Negative for chills and fever.  HENT: Positive for congestion. Negative for ear pain and sore throat.   Eyes: Negative for pain and visual disturbance.  Respiratory: Positive for cough. Negative for shortness of breath.   Cardiovascular: Negative for chest pain and palpitations.  Gastrointestinal: Negative for abdominal pain, diarrhea, nausea and vomiting.  Genitourinary: Negative for dysuria and hematuria.  Musculoskeletal: Negative for arthralgias and back pain.  Skin: Negative for color change and rash.  Neurological: Negative for seizures and syncope.  All other systems reviewed and are negative.    Physical Exam Triage Vital Signs ED Triage Vitals  Enc Vitals Group     BP      Pulse      Resp      Temp      Temp src      SpO2      Weight      Height      Head Circumference      Peak Flow      Pain Score      Pain Loc      Pain Edu?      Excl. in [place]?    No data found.  Updated Vital Signs BP (!) 142/90 (BP Location: Left Arm)   Pulse 86   Temp 98.5 F (36.9 C) (Oral)   Resp 20   Wt 217 lb (98.4 kg)   SpO2 96%   BMI 27.86 kg/m   Visual Acuity Right Eye Distance:   Left Eye Distance:   Bilateral Distance:    Right Eye Near:   Left Eye Near:    Bilateral Near:     Physical Exam Vitals and nursing note reviewed.  Constitutional:      General: He is not in acute distress.    Appearance: He is well-developed.  HENT:     Head: Normocephalic and atraumatic.     Right Ear: Tympanic membrane normal.     Left Ear: Tympanic membrane normal.     Nose: Nose normal.      Mouth/Throat:     Mouth: Mucous membranes are moist.     Pharynx: Oropharynx is clear.  Eyes:     Conjunctiva/sclera: Conjunctivae normal.  Cardiovascular:     Rate and Rhythm: Normal rate and regular rhythm.     Heart sounds: No murmur.  Pulmonary:     Effort: Pulmonary effort is normal. No respiratory distress.     Breath sounds: Rhonchi present.  Comments: Lower lung rhonchi, R>L.   Abdominal:     General: Bowel sounds are normal.     Palpations: Abdomen is soft.     Tenderness: There is no abdominal tenderness. There is no guarding or rebound.  Musculoskeletal:     Cervical back: Neck supple.  Skin:    General: Skin is warm and dry.  Neurological:     Mental Status: He is alert.      UC Treatments / Results  Labs (all labs ordered are listed, but only abnormal results are displayed) Labs Reviewed - No data to display  EKG   Radiology DG Chest 2 View  Result Date: 11/19/2019 CLINICAL DATA:  Rhonchi. EXAM: CHEST - 2 VIEW COMPARISON:  02/16/2005 FINDINGS: Mild scar-like opacity in the infrahilar lungs with improved aeration from before. Low lung volumes is accentuated by eventration of the right diaphragm. IMPRESSION: No acute finding when compared with prior. Electronically Signed   By: Marnee Spring M.D.   On: 11/19/2019 11:23    Procedures Procedures (including critical care time)  Medications Ordered in UC Medications - No data to display  Initial Impression / Assessment and Plan / UC Course  I have reviewed the triage vital signs and the nursing notes.  Pertinent labs & imaging results that were available during my care of the patient were reviewed by me and considered in my medical decision making (see chart for details).   Lower respiratory infection.  CXR negative.  Stop Zithromax and start doxycycline.  Instructed patient to follow-up with his PCP for a recheck of his lungs in 2 to 3 days.  Instructed him to go to the ED if he has acute worsening  symptoms.  Patient agrees to plan of care.     Final Clinical Impressions(s) / UC Diagnoses   Final diagnoses:  Lower respiratory infection     Discharge Instructions     Go directly to [facility] for your chest x-ray.  I will call you with the results.    Take the doxycycline as directed.  Call your primary care provider to schedule an appointment for follow-up in 2 to 3 days.    Go to the emergency department if you have acute worsening symptoms.        ED Prescriptions    Medication Sig Dispense Auth. Provider   doxycycline (VIBRAMYCIN) 100 MG capsule Take 1 capsule (100 mg total) by mouth 2 (two) times daily. 20 capsule Mickie Bail, NP     PDMP not reviewed this encounter.   Mickie Bail, NP 11/19/19 1131

## 2019-11-19 NOTE — ED Triage Notes (Signed)
Patient in office today c/o f/u diarrhea,nausea, fever and smell  LLV:[id number]

## 2019-11-19 NOTE — Discharge Instructions (Addendum)
Go directly to [facility] for your chest x-ray.  I will call you with the results.    Take the doxycycline as directed.  Call your primary care provider to schedule an appointment for follow-up in 2 to 3 days.    Go to the emergency department if you have acute worsening symptoms.

## 2019-11-22 DIAGNOSIS — J40 Bronchitis, not specified as acute or chronic: Secondary | ICD-10-CM | POA: Diagnosis not present

## 2019-11-22 DIAGNOSIS — Z20822 Contact with and (suspected) exposure to covid-19: Secondary | ICD-10-CM | POA: Diagnosis not present

## 2019-11-22 DIAGNOSIS — Z7689 Persons encountering health services in other specified circumstances: Secondary | ICD-10-CM | POA: Diagnosis not present

## 2019-11-26 DIAGNOSIS — J40 Bronchitis, not specified as acute or chronic: Secondary | ICD-10-CM | POA: Diagnosis not present

## 2019-11-26 DIAGNOSIS — E871 Hypo-osmolality and hyponatremia: Secondary | ICD-10-CM | POA: Diagnosis not present

## 2019-12-24 DIAGNOSIS — I10 Essential (primary) hypertension: Secondary | ICD-10-CM | POA: Diagnosis not present

## 2019-12-24 DIAGNOSIS — E871 Hypo-osmolality and hyponatremia: Secondary | ICD-10-CM | POA: Diagnosis not present

## 2019-12-24 DIAGNOSIS — M5441 Lumbago with sciatica, right side: Secondary | ICD-10-CM | POA: Diagnosis not present

## 2019-12-24 DIAGNOSIS — M47812 Spondylosis without myelopathy or radiculopathy, cervical region: Secondary | ICD-10-CM | POA: Diagnosis not present

## 2019-12-24 DIAGNOSIS — K219 Gastro-esophageal reflux disease without esophagitis: Secondary | ICD-10-CM | POA: Diagnosis not present

## 2019-12-24 DIAGNOSIS — M545 Low back pain: Secondary | ICD-10-CM | POA: Diagnosis not present

## 2019-12-24 DIAGNOSIS — E785 Hyperlipidemia, unspecified: Secondary | ICD-10-CM | POA: Diagnosis not present

## 2019-12-24 DIAGNOSIS — M542 Cervicalgia: Secondary | ICD-10-CM | POA: Diagnosis not present

## 2019-12-24 DIAGNOSIS — R040 Epistaxis: Secondary | ICD-10-CM | POA: Diagnosis not present

## 2020-01-04 DIAGNOSIS — I10 Essential (primary) hypertension: Secondary | ICD-10-CM | POA: Diagnosis not present

## 2020-01-04 DIAGNOSIS — M79661 Pain in right lower leg: Secondary | ICD-10-CM | POA: Diagnosis not present

## 2020-01-24 DIAGNOSIS — Z03818 Encounter for observation for suspected exposure to other biological agents ruled out: Secondary | ICD-10-CM | POA: Diagnosis not present

## 2020-01-24 DIAGNOSIS — Z20822 Contact with and (suspected) exposure to covid-19: Secondary | ICD-10-CM | POA: Diagnosis not present

## 2020-02-12 DIAGNOSIS — K3189 Other diseases of stomach and duodenum: Secondary | ICD-10-CM | POA: Diagnosis not present

## 2020-02-19 DIAGNOSIS — H401131 Primary open-angle glaucoma, bilateral, mild stage: Secondary | ICD-10-CM | POA: Diagnosis not present

## 2020-02-26 DIAGNOSIS — K219 Gastro-esophageal reflux disease without esophagitis: Secondary | ICD-10-CM | POA: Diagnosis not present

## 2020-02-26 DIAGNOSIS — I10 Essential (primary) hypertension: Secondary | ICD-10-CM | POA: Diagnosis not present

## 2020-02-26 DIAGNOSIS — E785 Hyperlipidemia, unspecified: Secondary | ICD-10-CM | POA: Diagnosis not present

## 2020-02-26 DIAGNOSIS — K3189 Other diseases of stomach and duodenum: Secondary | ICD-10-CM | POA: Diagnosis not present

## 2020-02-26 DIAGNOSIS — N400 Enlarged prostate without lower urinary tract symptoms: Secondary | ICD-10-CM | POA: Diagnosis not present

## 2020-02-26 DIAGNOSIS — G4733 Obstructive sleep apnea (adult) (pediatric): Secondary | ICD-10-CM | POA: Diagnosis not present

## 2020-03-07 IMAGING — US US RENAL
1 series · 14 of 25 positions shown · non-contrast
Comparison: CT 02/19/2019.

CLINICAL DATA: Left ureteral stone.

EXAM:
RENAL / URINARY TRACT ULTRASOUND COMPLETE

[Series 1: us renal · 0.26mm/px · 14 of 40 slices shown]
[im 1/40]
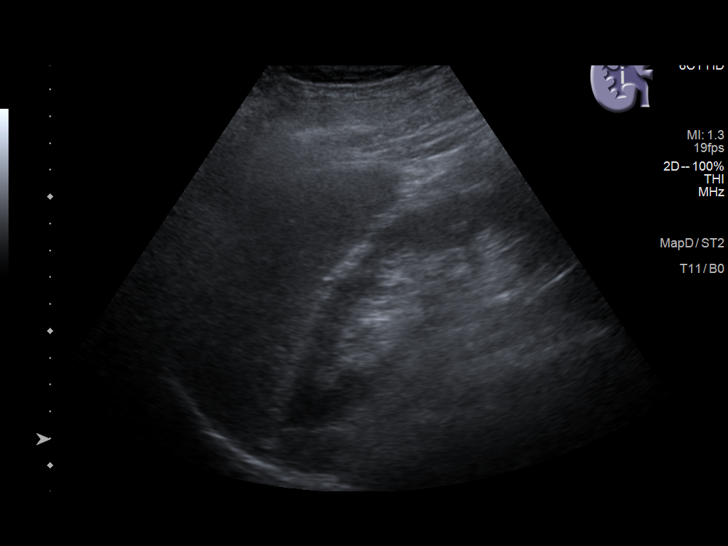
[im 4/40]
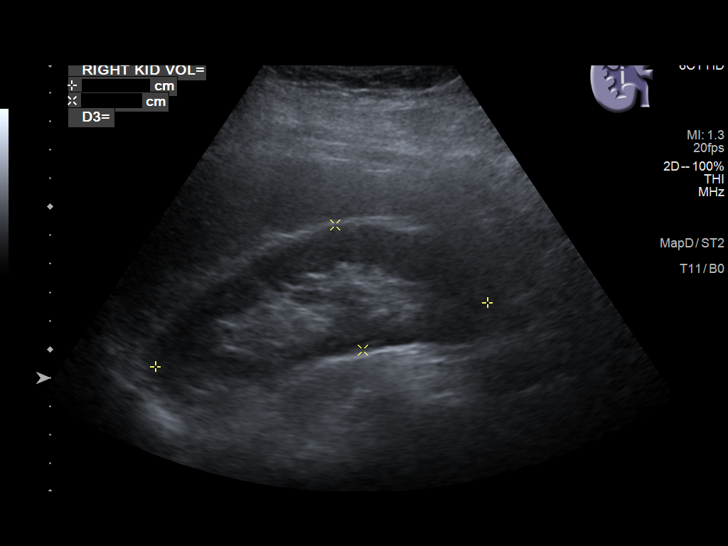
[im 7/40]
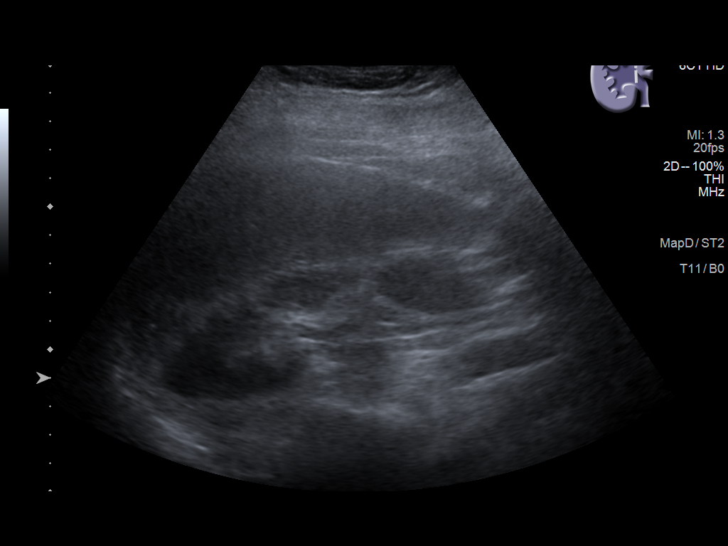
[im 10/40]
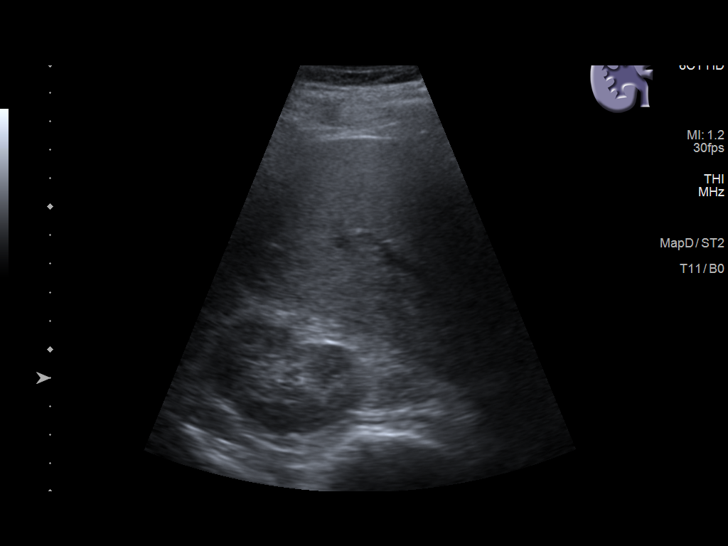
[im 14/40]
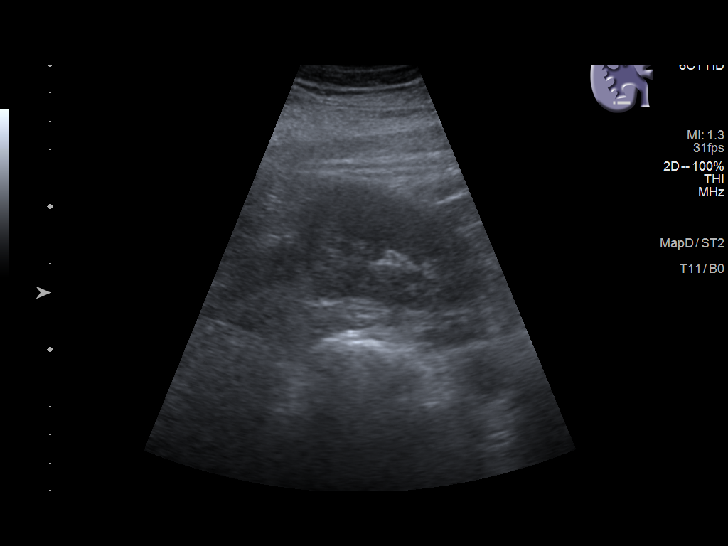
[im 15/40]
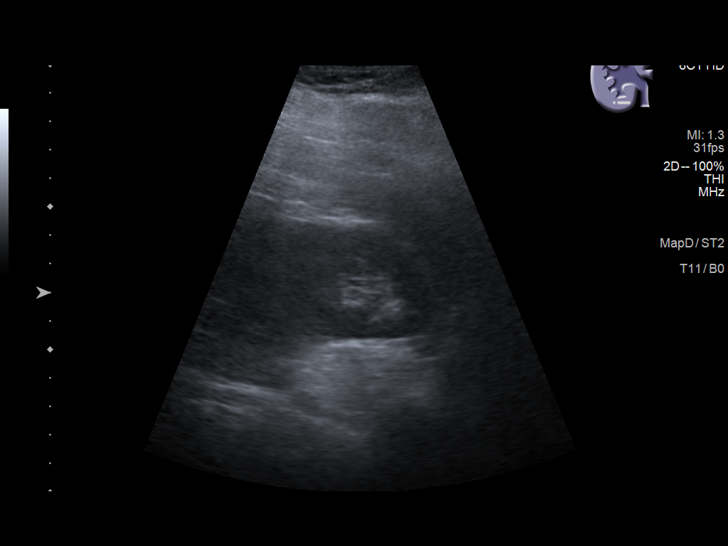
[im 18/40]
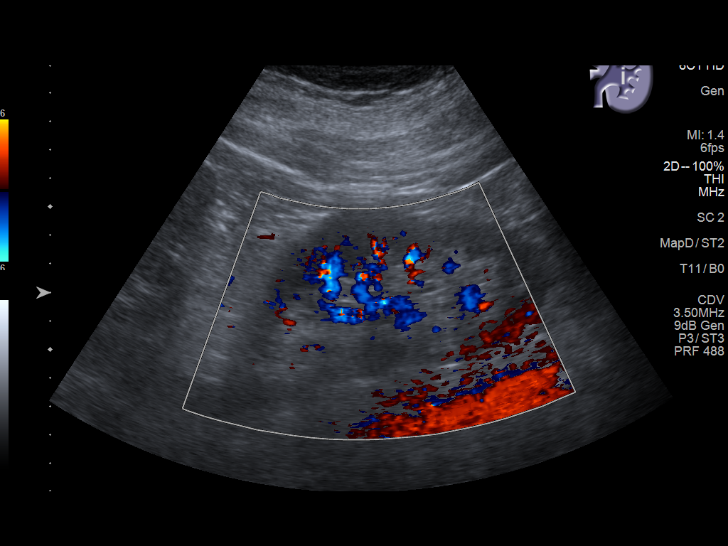
[im 22/40]
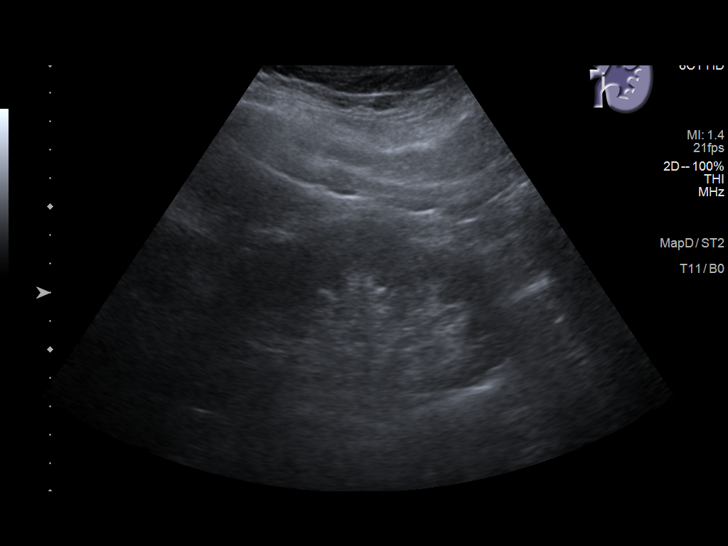
[im 25/40]
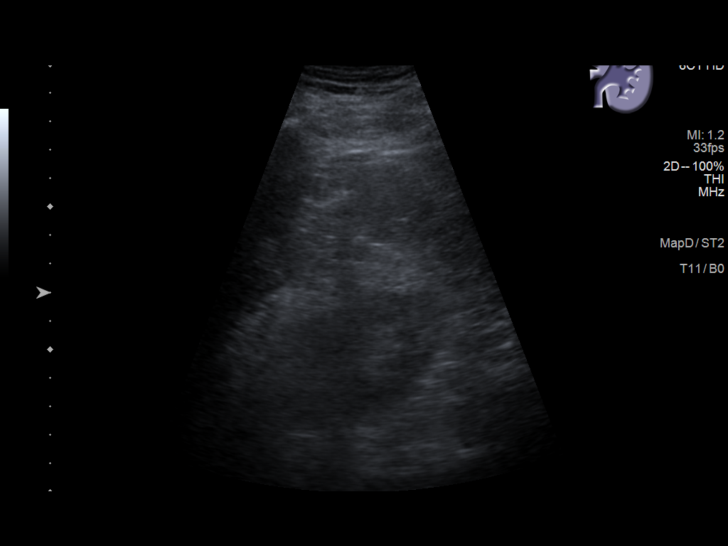
[im 27/40]
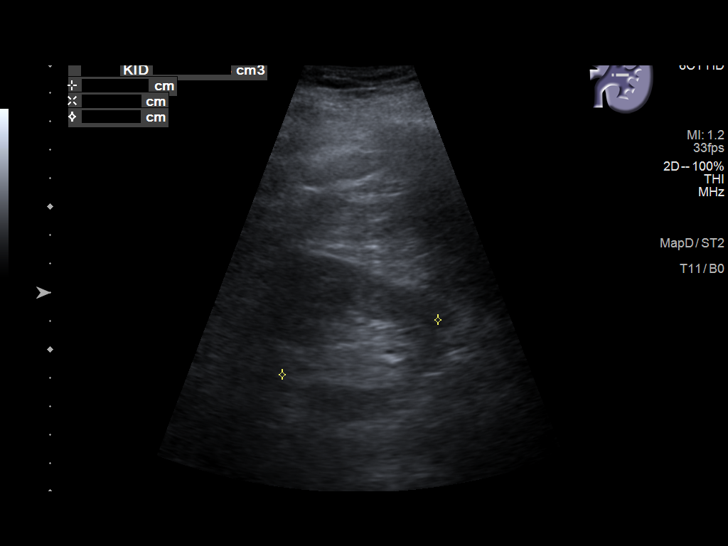
[im 30/40]
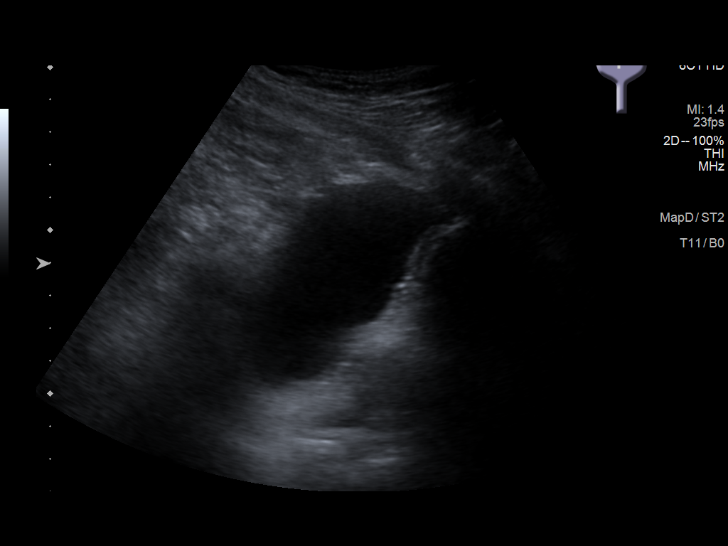
[im 33/40]
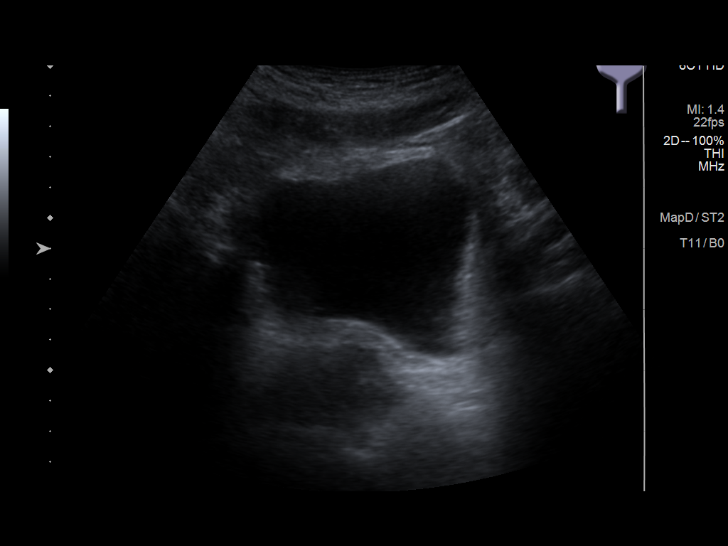
[im 36/40]
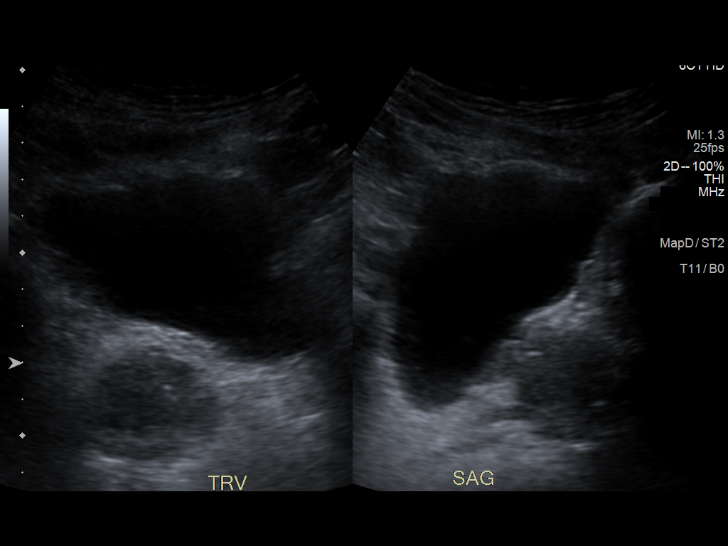
[im 40/40]
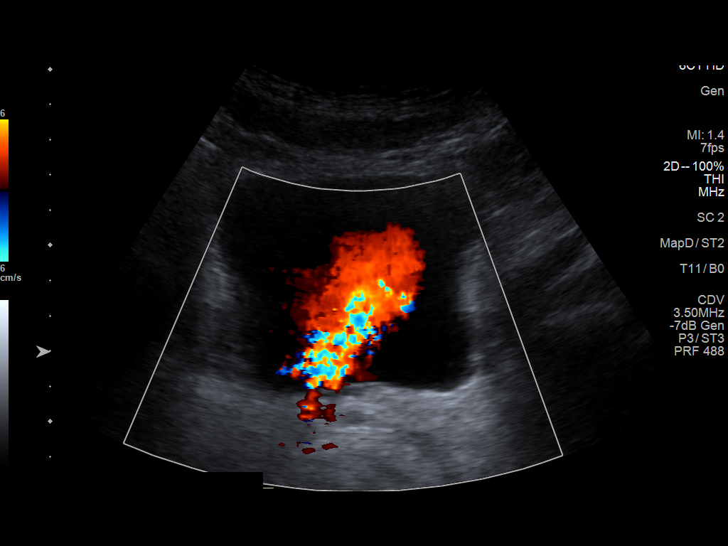

[14 of 25 positions shown; findings below may reference images not displayed]

FINDINGS: Right Kidney:

Renal measurements: 11.9 x 4.5 x 5.1 cm = volume: 142.1 mL .
Echogenicity within normal limits. No mass or hydronephrosis
visualized.

Left Kidney:

Renal measurements: 13.5 x 6.0 x 5.8 cm = volume: 243.3 mL.
Echogenicity within normal limits. No mass or hydronephrosis
visualized.

Bladder:

Appears normal for degree of bladder distention. Prostate appears
slightly prominent.
IMPRESSION: No acute abnormality. Specifically no hydronephrosis or bladder
distention.

## 2020-05-09 DIAGNOSIS — K219 Gastro-esophageal reflux disease without esophagitis: Secondary | ICD-10-CM | POA: Diagnosis not present

## 2020-05-09 DIAGNOSIS — E785 Hyperlipidemia, unspecified: Secondary | ICD-10-CM | POA: Diagnosis not present

## 2020-05-09 DIAGNOSIS — I10 Essential (primary) hypertension: Secondary | ICD-10-CM | POA: Diagnosis not present

## 2020-05-09 DIAGNOSIS — R252 Cramp and spasm: Secondary | ICD-10-CM | POA: Diagnosis not present

## 2020-07-31 ENCOUNTER — Other Ambulatory Visit: Payer: Self-pay

## 2020-07-31 DIAGNOSIS — Z0000 Encounter for general adult medical examination without abnormal findings: Secondary | ICD-10-CM

## 2020-07-31 LAB — POCT URINALYSIS DIPSTICK
Bilirubin, UA: NEGATIVE
Blood, UA: NEGATIVE
Glucose, UA: NEGATIVE
Ketones, UA: NEGATIVE
Leukocytes, UA: NEGATIVE
Nitrite, UA: NEGATIVE
Protein, UA: NEGATIVE
Spec Grav, UA: 1.02 (ref 1.010–1.025)
Urobilinogen, UA: 0.2 E.U./dL
pH, UA: 6 (ref 5.0–8.0)

## 2020-07-31 NOTE — Progress Notes (Signed)
Rodney Webb will turn 64 on 05/06/21 & will age off the [employer] & will no longer be able to come to [facility] for PCP.  He has chosen to go ahead & get a PCP at [facility]) & has a physical scheduled 08/11/20 with Debbra Riding, PA.  Presents today for labwork for upcoming physical - Magnesium level, CBC, CMP, Lipid Panel, A1c, PSA, TSH, Vitamin B12 & Urinalysis.  Blood specimen collected for EX1M panel & added A1c, Vitamin B12 & Magnesium. Urine specimen collected for POCT Urinalysis.  Lab results need to be faxed to [phone] to Debbra Riding, PA & Rodney Hart is picking up a copy at the clinic.  AMD

## 2020-08-01 ENCOUNTER — Other Ambulatory Visit: Payer: Managed Care, Other (non HMO)

## 2020-08-01 LAB — CMP12+LP+TP+TSH+6AC+PSA+CBC…
ALT: 22 IU/L (ref 0–44)
AST: 24 IU/L (ref 0–40)
Albumin/Globulin Ratio: 1.8 (ref 1.2–2.2)
Albumin: 4.7 g/dL (ref 3.8–4.8)
Alkaline Phosphatase: 63 IU/L (ref 44–121)
BUN/Creatinine Ratio: 19 (ref 10–24)
BUN: 17 mg/dL (ref 8–27)
Basophils Absolute: 0 10*3/uL (ref 0.0–0.2)
Basos: 1 %
Bilirubin Total: 0.6 mg/dL (ref 0.0–1.2)
Calcium: 9.4 mg/dL (ref 8.6–10.2)
Chloride: 102 mmol/L (ref 96–106)
Chol/HDL Ratio: 4.5 ratio (ref 0.0–5.0)
Cholesterol, Total: 243 mg/dL — ABNORMAL HIGH (ref 100–199)
Creatinine, Ser: 0.91 mg/dL (ref 0.76–1.27)
EOS (ABSOLUTE): 0.1 10*3/uL (ref 0.0–0.4)
Eos: 2 %
Estimated CHD Risk: 0.9 times avg. (ref 0.0–1.0)
Free Thyroxine Index: 1.5 (ref 1.2–4.9)
GFR calc Af Amer: 103 mL/min/{1.73_m2} (ref >59)
GFR calc non Af Amer: 89 mL/min/{1.73_m2} (ref >59)
GGT: 12 IU/L (ref 0–65)
Globulin, Total: 2.6 g/dL (ref 1.5–4.5)
Glucose: 90 mg/dL (ref 65–99)
HDL: 54 mg/dL (ref >39)
Hematocrit: 44.8 % (ref 37.5–51.0)
Hemoglobin: 15.9 g/dL (ref 13.0–17.7)
Immature Grans (Abs): 0 10*3/uL (ref 0.0–0.1)
Immature Granulocytes: 0 %
Iron: 102 ug/dL (ref 38–169)
LDH: 182 IU/L (ref 121–224)
LDL Chol Calc (NIH): 176 mg/dL — ABNORMAL HIGH (ref 0–99)
Lymphocytes Absolute: 1.2 10*3/uL (ref 0.7–3.1)
Lymphs: 25 %
MCH: 32.8 pg (ref 26.6–33.0)
MCHC: 35.5 g/dL (ref 31.5–35.7)
MCV: 92 fL (ref 79–97)
Monocytes Absolute: 0.4 10*3/uL (ref 0.1–0.9)
Monocytes: 9 %
Neutrophils Absolute: 3 10*3/uL (ref 1.4–7.0)
Neutrophils: 63 %
Phosphorus: 2.8 mg/dL (ref 2.8–4.1)
Platelets: 225 10*3/uL (ref 150–450)
Potassium: 4.4 mmol/L (ref 3.5–5.2)
Prostate Specific Ag, Serum: 0.3 ng/mL (ref 0.0–4.0)
RBC: 4.85 x10E6/uL (ref 4.14–5.80)
RDW: 11.8 % (ref 11.6–15.4)
Sodium: 140 mmol/L (ref 134–144)
T3 Uptake Ratio: 24 % (ref 24–39)
T4, Total: 6.1 ug/dL (ref 4.5–12.0)
TSH: 1.54 u[IU]/mL (ref 0.450–4.500)
Total Protein: 7.3 g/dL (ref 6.0–8.5)
Triglycerides: 76 mg/dL (ref 0–149)
Uric Acid: 5.4 mg/dL (ref 3.8–8.4)
VLDL Cholesterol Cal: 13 mg/dL (ref 5–40)
WBC: 4.7 10*3/uL (ref 3.4–10.8)

## 2020-08-01 LAB — HGB A1C W/O EAG: Hgb A1c MFr Bld: 5.7 % — ABNORMAL HIGH (ref 4.8–5.6)

## 2020-08-01 LAB — MAGNESIUM: Magnesium: 2.1 mg/dL (ref 1.6–2.3)

## 2020-08-01 LAB — VITAMIN B12: Vitamin B-12: 693 pg/mL (ref 232–1245)

## 2020-08-11 DIAGNOSIS — E785 Hyperlipidemia, unspecified: Secondary | ICD-10-CM | POA: Diagnosis not present

## 2020-08-11 DIAGNOSIS — N401 Enlarged prostate with lower urinary tract symptoms: Secondary | ICD-10-CM | POA: Diagnosis not present

## 2020-08-11 DIAGNOSIS — I10 Essential (primary) hypertension: Secondary | ICD-10-CM | POA: Diagnosis not present

## 2020-08-11 DIAGNOSIS — M79604 Pain in right leg: Secondary | ICD-10-CM | POA: Diagnosis not present

## 2020-08-26 DIAGNOSIS — H401131 Primary open-angle glaucoma, bilateral, mild stage: Secondary | ICD-10-CM | POA: Diagnosis not present

## 2020-08-29 ENCOUNTER — Other Ambulatory Visit
Admission: RE | Admit: 2020-08-29 | Discharge: 2020-08-29 | Disposition: A | Discharge status: Home / Self Care | Payer: 59 | Source: Ambulatory Visit | Attending: Family Medicine | Admitting: Family Medicine

## 2020-08-29 DIAGNOSIS — R0789 Other chest pain: Secondary | ICD-10-CM | POA: Diagnosis not present

## 2020-08-29 DIAGNOSIS — R350 Frequency of micturition: Secondary | ICD-10-CM | POA: Diagnosis not present

## 2020-08-29 DIAGNOSIS — I10 Essential (primary) hypertension: Secondary | ICD-10-CM | POA: Diagnosis not present

## 2020-08-29 DIAGNOSIS — E785 Hyperlipidemia, unspecified: Secondary | ICD-10-CM | POA: Diagnosis not present

## 2020-08-29 LAB — TROPONIN I (HIGH SENSITIVITY): Troponin I (High Sensitivity): 3 ng/L (ref <18)

## 2020-09-09 ENCOUNTER — Other Ambulatory Visit: Payer: Self-pay

## 2020-09-09 ENCOUNTER — Encounter: Payer: 59 | Attending: Physician Assistant | Admitting: Dietician

## 2020-09-09 ENCOUNTER — Encounter: Payer: Self-pay | Admitting: Dietician

## 2020-09-09 VITALS — Ht 74.0 in | Wt 206.9 lb

## 2020-09-09 DIAGNOSIS — E785 Hyperlipidemia, unspecified: Secondary | ICD-10-CM | POA: Insufficient documentation

## 2020-09-09 NOTE — Patient Instructions (Signed)
   Great job making healthy food choices and balanced meals, keep up the good work!  Continue to emphasize generous portions of low-carb vegetables, and eat lean protein foods with starchy foods in portions that are fist-size or less.   Include cholesterol lowering foods often -- beans, peas, whole grains, vegetables, fruits.

## 2020-09-09 NOTE — Progress Notes (Signed)
Medical Nutrition Therapy: Visit start time: 0900  end time: 1000  Assessment:  Diagnosis: hyperlipidemia Past medical history: HTN, GERD Psychosocial issues/ stress concerns: none  Preferred learning method:  Jill Alexanders . Hands-on   Current weight: 206.9lbs (with shoes and jacket) Height: 6'2" Medications, supplements: reconciled list in medical record  Progress and evaluation:   Patient reports recent diagnosis of pre-diabetes; 07/31/20 HbA1c result was 5.7%, with fasting BG of 139mg /dl.   Blood lipids on 07/31/20 -- total cholesterol 243, LDL 176, HDL 54, triglycerides 76   Patient reports making diet changes to reduce fried foods, added fats ie mayo, sweet snacks specifically Little Debbie snack cakes, and is eating more fruits as desserts/ sweets.   He stays active daily with farming work, throughout winter months also.   Physical activity: farming  Dietary Intake:  Usual eating pattern includes 3 meals and 2 snacks per day. Dining out frequency: 1 meals per week.  Breakfast: 2 eggs, 11-29-1985 sausage or [place] bacon, oatmeal with Stevia and honey or whole grain cereal made with sorghum, with 1% or skim milk Snack: small bag peanuts or almonds/ "nabs" crackers + diet soda Lunch: 2 sandwiches on whole grain bread with [place], mayo, + fruit ie banana, Stopped eating chips. Snack: same as am Supper: meat + veg ie carrots, cabbage, peas, beans; decreased starchy foods ie potatoes, rice, pasta Snack: none Beverages: water, 2c coffee in am with stevia, creamer; 0-1 diet soda daily  Nutrition Care Education: Topics covered:  Basic nutrition: basic food groups, appropriate nutrient balance, appropriate meal and snack schedule, general nutrition guidelines    Weight control: low sugar and low fat choices, portion control strategies including pre-portioning snacks, plate method for meal planning Diabetes prevention:  appropriate carb intake and balance, healthy carb choices, role of  fiber, protein, fat Hypertension: identifying high sodium foods, identifying food sources of potassium, magnesium Hyperlipidemia:  target goals for lipids, healthy and unhealthy fats, role of fiber, plant sterols, role of exercise   Nutritional Diagnosis:  -2.2 Altered nutrition-related laboratory As related to hyperlipidemia and prediabetes.  As evidenced by recent elevated total and LDL cholesterol, and elevated HbA1C and fasting glucose.  Intervention:  . Instruction and discussion as noted above. . Patient has made significant positive diet changes in effort to improve cholesterol and blood sugar.  [place] He will continue to work on consuming nutritionally balanced meals, limit saturated fat and sugar/ refined carbohydrate intake, and emphasize whole plant foods.   Education Materials given:  . General diet guidelines for Cholesterol-lowering/ Heart health . Plate Planner with food lists . Sample menus . Foods to Choose to Lower Cholesterol . Visit summary with goals/ instructions   Learner/ who was taught:  . Patient   Level of understanding: Marland Kitchen Verbalizes/ demonstrates competency   Demonstrated degree of understanding via:   Teach back Learning barriers: . None  Willingness to learn/ readiness for change: . Eager, change in progress   Monitoring and Evaluation:  Dietary intake, exercise, blood lipids and sugar, and body weight      follow up: prn

## 2020-11-03 DIAGNOSIS — Z8669 Personal history of other diseases of the nervous system and sense organs: Secondary | ICD-10-CM | POA: Diagnosis not present

## 2020-11-03 DIAGNOSIS — I10 Essential (primary) hypertension: Secondary | ICD-10-CM | POA: Diagnosis not present

## 2020-11-03 DIAGNOSIS — K219 Gastro-esophageal reflux disease without esophagitis: Secondary | ICD-10-CM | POA: Diagnosis not present

## 2020-11-03 DIAGNOSIS — E785 Hyperlipidemia, unspecified: Secondary | ICD-10-CM | POA: Diagnosis not present

## 2020-11-10 DIAGNOSIS — N529 Male erectile dysfunction, unspecified: Secondary | ICD-10-CM | POA: Diagnosis not present

## 2020-11-10 DIAGNOSIS — E785 Hyperlipidemia, unspecified: Secondary | ICD-10-CM | POA: Diagnosis not present

## 2020-11-10 DIAGNOSIS — N401 Enlarged prostate with lower urinary tract symptoms: Secondary | ICD-10-CM | POA: Diagnosis not present

## 2020-11-10 DIAGNOSIS — K219 Gastro-esophageal reflux disease without esophagitis: Secondary | ICD-10-CM | POA: Diagnosis not present

## 2020-11-11 DIAGNOSIS — N529 Male erectile dysfunction, unspecified: Secondary | ICD-10-CM | POA: Diagnosis not present

## 2020-11-21 IMAGING — CR DG CHEST 2V
1 series · 2 of 2 positions shown · non-contrast
Comparison: 02/16/2005

CLINICAL DATA: Rhonchi.

EXAM:
CHEST - 2 VIEW

[Series 1: dg chest 2 view · 0.14mm/px · 2 of 2 slices shown]
[im 1/2]
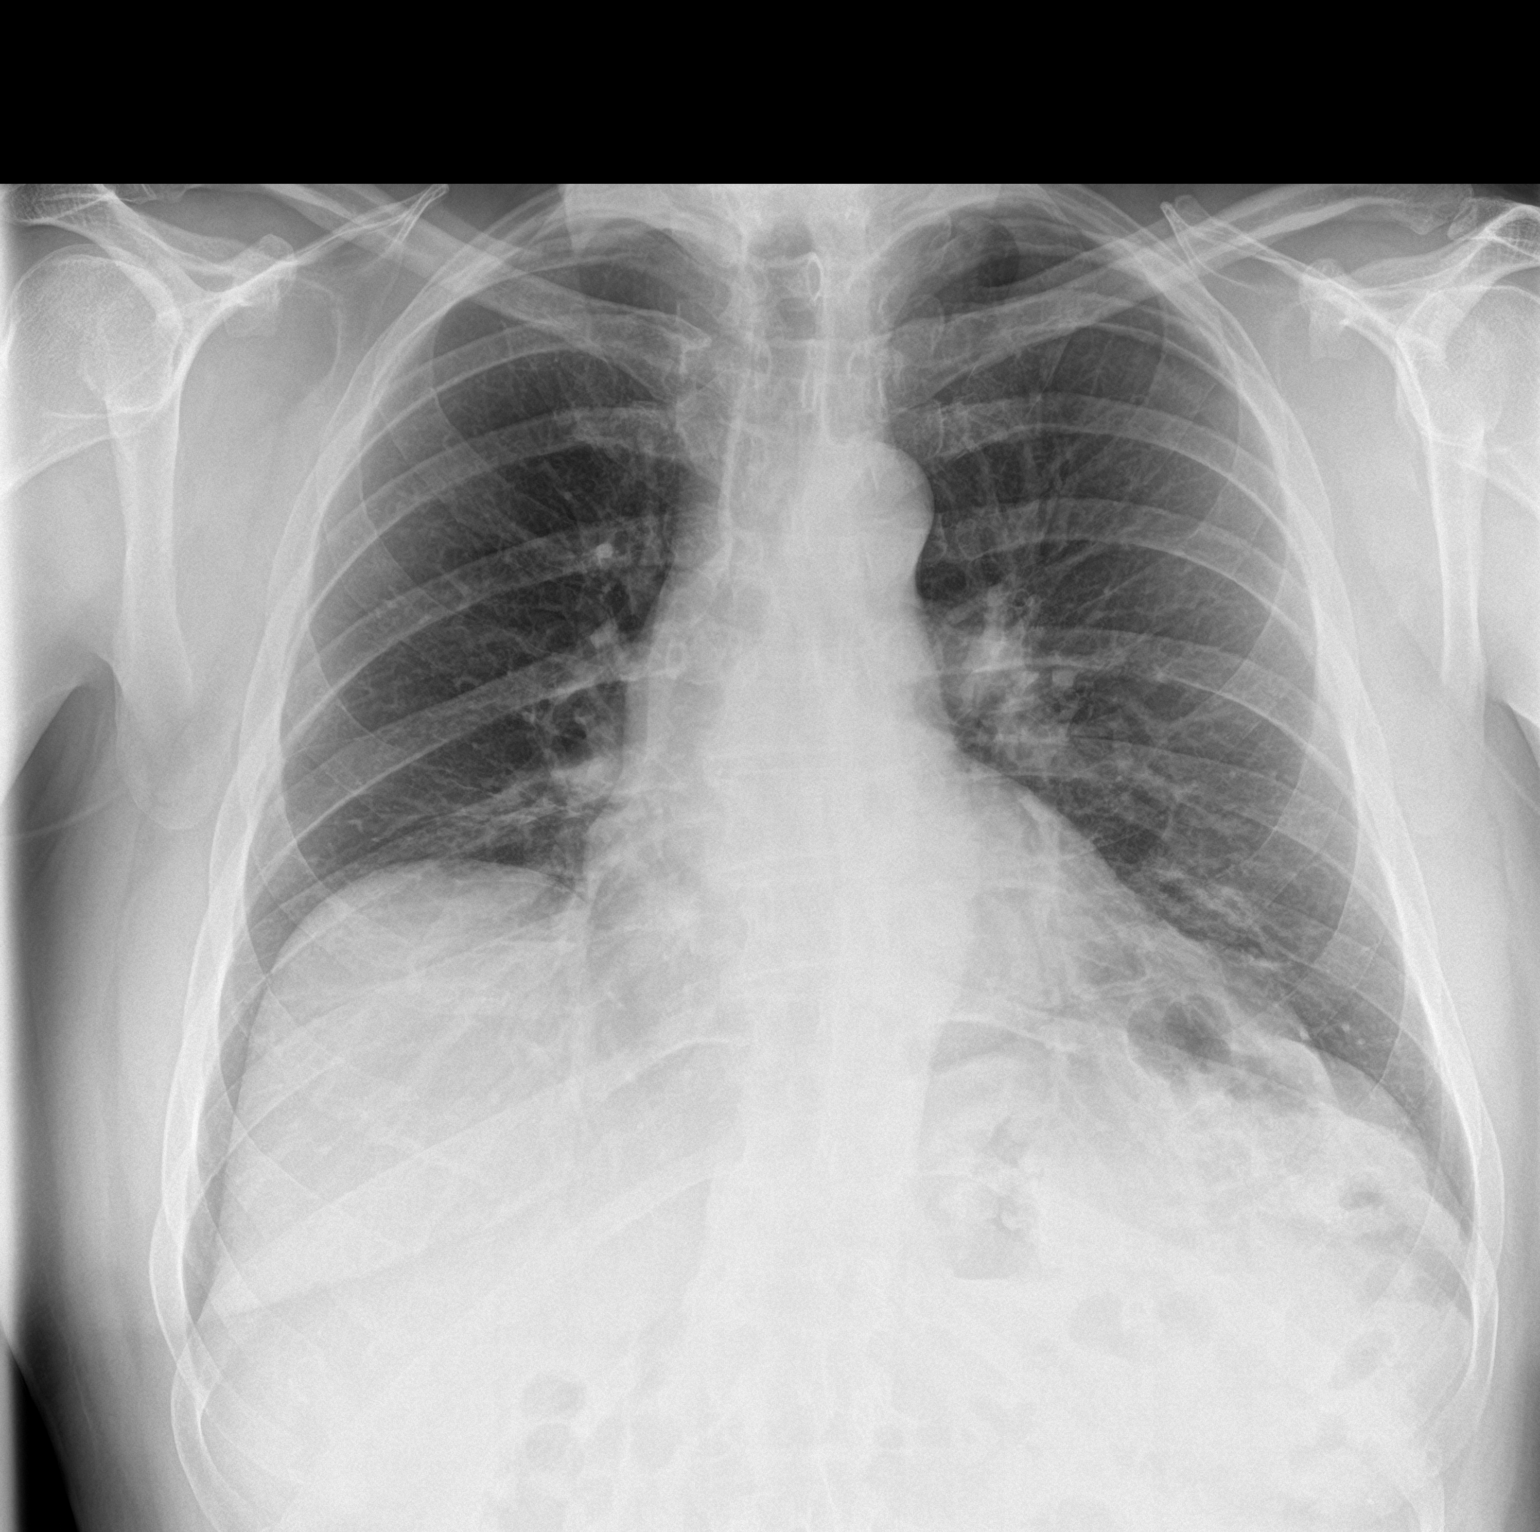
[im 2/2]
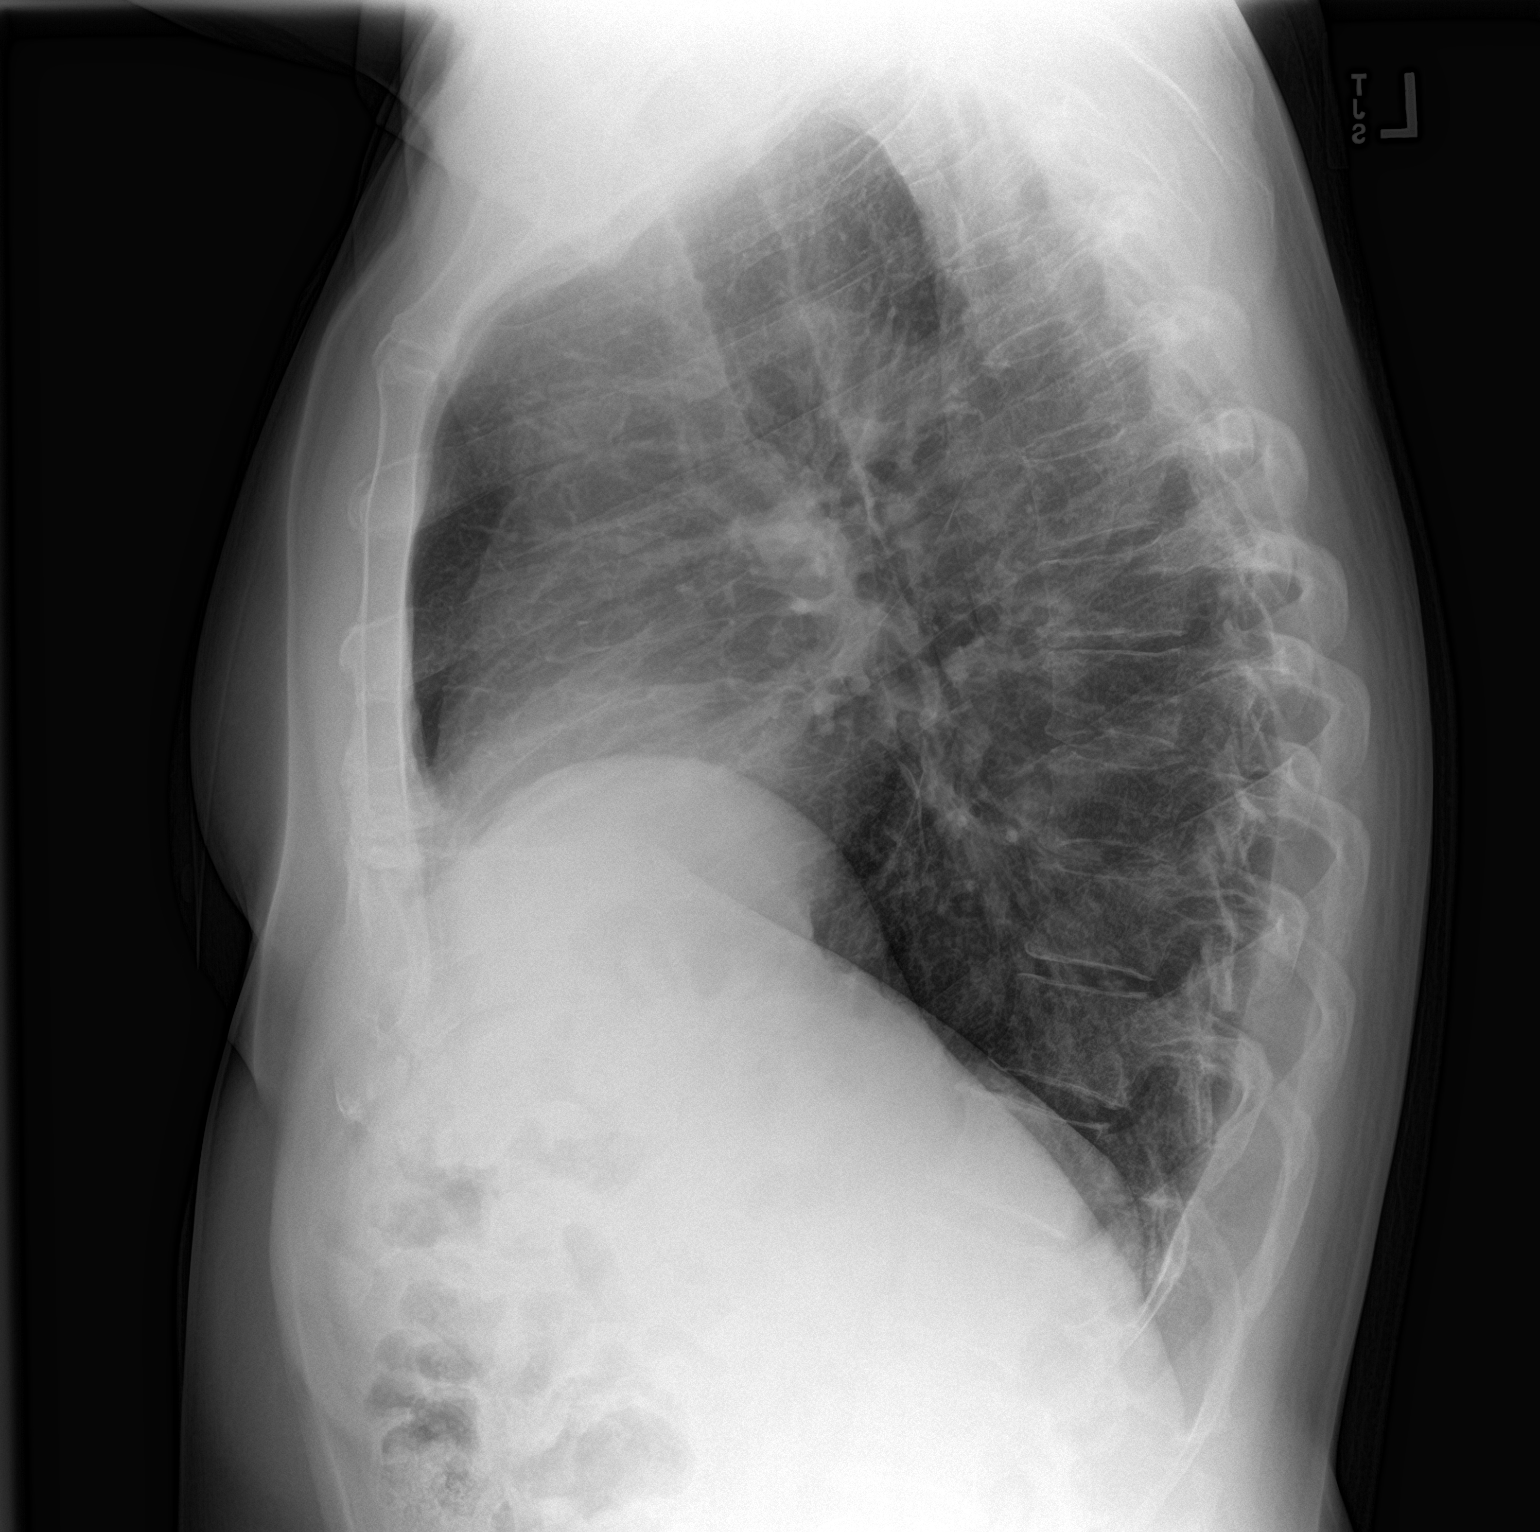

[2 of 2 positions shown; findings below may reference images not displayed]

FINDINGS: Mild scar-like opacity in the infrahilar lungs with improved
aeration from before. Low lung volumes is accentuated by eventration
of the right diaphragm.
IMPRESSION: No acute finding when compared with prior.

## 2020-11-27 DIAGNOSIS — M2012 Hallux valgus (acquired), left foot: Secondary | ICD-10-CM | POA: Diagnosis not present

## 2020-11-27 DIAGNOSIS — M79672 Pain in left foot: Secondary | ICD-10-CM | POA: Diagnosis not present

## 2020-12-11 DIAGNOSIS — R350 Frequency of micturition: Secondary | ICD-10-CM | POA: Diagnosis not present

## 2020-12-11 DIAGNOSIS — N401 Enlarged prostate with lower urinary tract symptoms: Secondary | ICD-10-CM | POA: Diagnosis not present

## 2020-12-11 DIAGNOSIS — N5201 Erectile dysfunction due to arterial insufficiency: Secondary | ICD-10-CM | POA: Diagnosis not present

## 2021-03-03 DIAGNOSIS — H401131 Primary open-angle glaucoma, bilateral, mild stage: Secondary | ICD-10-CM | POA: Diagnosis not present

## 2021-08-03 ENCOUNTER — Ambulatory Visit: Payer: Self-pay

## 2021-11-13 ENCOUNTER — Other Ambulatory Visit: Payer: Self-pay | Admitting: Family Medicine

## 2021-11-13 ENCOUNTER — Ambulatory Visit
Admission: RE | Admit: 2021-11-13 | Discharge: 2021-11-13 | Disposition: A | Discharge status: Home / Self Care | Payer: Medicare Other | Source: Ambulatory Visit | Attending: Family Medicine | Admitting: Family Medicine

## 2021-11-13 ENCOUNTER — Other Ambulatory Visit: Payer: Self-pay

## 2021-11-13 DIAGNOSIS — R1031 Right lower quadrant pain: Secondary | ICD-10-CM

## 2021-11-13 DIAGNOSIS — K4090 Unilateral inguinal hernia, without obstruction or gangrene, not specified as recurrent: Secondary | ICD-10-CM

## 2021-11-13 LAB — POCT I-STAT CREATININE: Creatinine, Ser: 0.9 mg/dL (ref 0.61–1.24)

## 2021-11-13 MED ORDER — IOHEXOL 300 MG/ML  SOLN
100.0000 mL | Freq: Once | INTRAMUSCULAR | Status: AC | PRN
  Administered 2021-11-13: 100 mL via INTRAVENOUS

## 2021-11-17 ENCOUNTER — Ambulatory Visit: Payer: BLUE CROSS/BLUE SHIELD

## 2021-11-20 ENCOUNTER — Ambulatory Visit: Payer: Self-pay | Admitting: Surgery

## 2021-12-01 ENCOUNTER — Encounter
Admission: RE | Admit: 2021-12-01 | Discharge: 2021-12-01 | Disposition: A | Discharge status: Home / Self Care | Payer: Medicare Other | Source: Ambulatory Visit | Attending: Surgery | Admitting: Surgery

## 2021-12-01 ENCOUNTER — Other Ambulatory Visit: Payer: Self-pay

## 2021-12-01 DIAGNOSIS — Z01810 Encounter for preprocedural cardiovascular examination: Secondary | ICD-10-CM | POA: Insufficient documentation

## 2021-12-01 NOTE — Patient Instructions (Addendum)
Your procedure is scheduled on: Thursday 12/03/21 ?Report to the Registration Desk on the 1st floor of the Medical Mall. ?To find out your arrival time, please call [phone] between 1PM - 3PM on: Wednesday 12/02/21 ? ?REMEMBER: ?Instructions that are not followed completely may result in serious medical risk, up to and including death; or upon the discretion of your surgeon and anesthesiologist your surgery may need to be rescheduled. ? ?Do not eat food after midnight the night before surgery.  ?No gum chewing, lozengers or hard candies. ? ?You may however, drink CLEAR liquids up to 2 hours before you are scheduled to arrive for your surgery. Do not drink anything within 2 hours of your scheduled arrival time. ? ?Clear liquids include: ?- water  ?- apple juice without pulp ?- gatorade (not RED colors) ?- black coffee or tea (Do NOT add milk or creamers to the coffee or tea) ?Do NOT drink anything that is not on this list. ? ?TAKE THESE MEDICATIONS THE MORNING OF SURGERY WITH A SIP OF WATER: ?esomeprazole (NEXIUM) 40 MG capsule (take one the night before and one on the morning of surgery - helps to prevent nausea after surgery.) ? ?One week prior to surgery: ?Stop Anti-inflammatories (NSAIDS) such as Advil, Aleve, Ibuprofen, Motrin, Naproxen, Naprosyn and Aspirin based products such as Excedrin, Goodys Powder, BC Powder. ? ?Stop taking your Cholecalciferol (VITAMIN D3) 50 MCG (2000 UT) TABS, Cinnamon Oil OIL, Digestive Enzymes (DIGESTIVE ENZYME PO), Glucosamine-Chondroitin (OSTEO BI-FLEX REGULAR STRENGTH PO, Lemon Grass Oil and ANY other OVER THE COUNTER supplements until after surgery. ? ?You may however, continue to take Tylenol if needed for pain up until the day of surgery. ? ?No Alcohol for 24 hours before or after surgery. ? ?No Smoking including e-cigarettes for 24 hours prior to surgery.  ?No chewable tobacco products for at least 6 hours prior to surgery.  ?No nicotine patches on the day of  surgery. ? ?Do not use any "recreational" drugs for at least a week prior to your surgery.  ?Please be advised that the combination of cocaine and anesthesia may have negative outcomes, up to and including death. ?If you test positive for cocaine, your surgery will be cancelled. ? ?On the morning of surgery brush your teeth with toothpaste and water, you may rinse your mouth with mouthwash if you wish. ?Do not swallow any toothpaste or mouthwash. ? ?Use CHG wipes as directed on instruction sheet. ? ?Do not wear jewelry. ? ?Do not wear lotions, powders, or colognes.  ? ?Do not shave body from the neck down 48 hours prior to surgery just in case you cut yourself which could leave a site for infection.  ?Also, freshly shaved skin may become irritated if using the CHG soap. ? ?hearing aids may not be worn into surgery. ? ?Do not bring valuables to the hospital. [facility] is not responsible for any missing/lost belongings or valuables.  ? ?Notify your doctor if there is any change in your medical condition (cold, fever, infection). ? ?Wear comfortable clothing (specific to your surgery type) to the hospital. ? ?When coughing or sneezing, hold a pillow firmly against your incision with both hands. This is called ?splinting.? Doing this helps protect your incision. It also decreases belly discomfort. ? ?If you are being discharged the day of surgery, you will not be allowed to drive home. ?You will need a responsible adult (18 years or older) to drive you home and stay with you that night.  ? ?If  you are taking public transportation, you will need to have a responsible adult (18 years or older) with you. ?Please confirm with your physician that it is acceptable to use public transportation.  ? ?Please call the Pre-admissions Testing Dept. at [phone] if you have any questions about these instructions. ? ?Surgery Visitation Policy: ? ?Patients undergoing a surgery or procedure may have two family members or support  persons with them as long as the person is not COVID-19 positive or experiencing its symptoms.  ? ?Inpatient Visitation:   ? ?Visiting hours are 7 a.m. to 8 p.m. ?Up to four visitors are allowed at one time in a patient room, including children. The visitors may rotate out with other people during the day. One designated support person (adult) may remain overnight. ? ?All Areas: ?All visitors must pass COVID-19 screenings, use hand sanitizer when entering and exiting the patient?s room and wear a mask at all times, including in the patient?s room. ?Patients must also wear a mask when staff or their visitor are in the room. ?Masking is required regardless of vaccination status.  ?

## 2021-12-02 ENCOUNTER — Encounter
Admission: RE | Admit: 2021-12-02 | Discharge: 2021-12-02 | Disposition: A | Discharge status: Home / Self Care | Payer: Medicare Other | Source: Ambulatory Visit | Attending: Surgery | Admitting: Surgery

## 2021-12-02 DIAGNOSIS — Z01810 Encounter for preprocedural cardiovascular examination: Secondary | ICD-10-CM | POA: Diagnosis present

## 2021-12-03 ENCOUNTER — Ambulatory Visit: Payer: Medicare Other | Admitting: Certified Registered"

## 2021-12-03 ENCOUNTER — Encounter: Payer: Self-pay | Admitting: Surgery

## 2021-12-03 ENCOUNTER — Other Ambulatory Visit: Payer: Self-pay

## 2021-12-03 ENCOUNTER — Ambulatory Visit
Admission: RE | Admit: 2021-12-03 | Discharge: 2021-12-03 | Disposition: A | Discharge status: Home / Self Care | Payer: Medicare Other | Attending: Surgery | Admitting: Surgery

## 2021-12-03 ENCOUNTER — Encounter
Admission: RE | Disposition: A | Discharge status: Home / Self Care | Payer: Self-pay | Source: Home / Self Care | Attending: Surgery

## 2021-12-03 DIAGNOSIS — G4730 Sleep apnea, unspecified: Secondary | ICD-10-CM | POA: Insufficient documentation

## 2021-12-03 DIAGNOSIS — Z87891 Personal history of nicotine dependence: Secondary | ICD-10-CM | POA: Diagnosis not present

## 2021-12-03 DIAGNOSIS — K4020 Bilateral inguinal hernia, without obstruction or gangrene, not specified as recurrent: Secondary | ICD-10-CM | POA: Insufficient documentation

## 2021-12-03 DIAGNOSIS — I10 Essential (primary) hypertension: Secondary | ICD-10-CM | POA: Diagnosis not present

## 2021-12-03 SURGERY — REPAIR, HERNIA, INGUINAL, BILATERAL, ROBOT-ASSISTED
Anesthesia: General | Site: Groin | Laterality: Bilateral

## 2021-12-03 MED ORDER — ROCURONIUM BROMIDE 100 MG/10ML IV SOLN
INTRAVENOUS | Status: DC | PRN
  Administered 2021-12-03: 50 mg via INTRAVENOUS
  Administered 2021-12-03: 20 mg via INTRAVENOUS
  Administered 2021-12-03: 10 mg via INTRAVENOUS

## 2021-12-03 MED ORDER — HYDROCODONE-ACETAMINOPHEN 5-325 MG PO TABS: 1.0000 | ORAL_TABLET | Freq: Four times a day (QID) | ORAL | 0 refills | Status: DC | PRN

## 2021-12-03 MED ORDER — ORAL CARE MOUTH RINSE: 15.0000 mL | Freq: Once | OROMUCOSAL | Status: AC

## 2021-12-03 MED ORDER — FENTANYL CITRATE (PF) 100 MCG/2ML IJ SOLN
25.0000 ug | INTRAMUSCULAR | Status: DC | PRN
  Administered 2021-12-03 (×3): 25 ug via INTRAVENOUS

## 2021-12-03 MED ORDER — PROPOFOL 10 MG/ML IV BOLUS
INTRAVENOUS | Status: DC | PRN
  Administered 2021-12-03: 150 mg via INTRAVENOUS

## 2021-12-03 MED ORDER — FENTANYL CITRATE (PF) 100 MCG/2ML IJ SOLN
INTRAMUSCULAR | Status: DC | PRN
  Administered 2021-12-03 (×2): 50 ug via INTRAVENOUS

## 2021-12-03 MED ORDER — CHLORHEXIDINE GLUCONATE 0.12 % MT SOLN
OROMUCOSAL | Status: AC
  Administered 2021-12-03: 15 mL via OROMUCOSAL
  Filled 2021-12-03: qty 15

## 2021-12-03 MED ORDER — ONDANSETRON HCL 4 MG/2ML IJ SOLN
INTRAMUSCULAR | Status: DC | PRN
  Administered 2021-12-03: 4 mg via INTRAVENOUS

## 2021-12-03 MED ORDER — ACETAMINOPHEN 500 MG PO TABS: 1000.0000 mg | ORAL_TABLET | ORAL | Status: AC

## 2021-12-03 MED ORDER — GABAPENTIN 300 MG PO CAPS: 300.0000 mg | ORAL_CAPSULE | ORAL | Status: AC

## 2021-12-03 MED ORDER — MIDAZOLAM HCL 2 MG/2ML IJ SOLN
INTRAMUSCULAR | Status: DC | PRN
  Administered 2021-12-03 (×2): 1 mg via INTRAVENOUS

## 2021-12-03 MED ORDER — CEFAZOLIN SODIUM-DEXTROSE 2-4 GM/100ML-% IV SOLN
2.0000 g | INTRAVENOUS | Status: AC
  Administered 2021-12-03: 2 g via INTRAVENOUS

## 2021-12-03 MED ORDER — KETOROLAC TROMETHAMINE 30 MG/ML IJ SOLN
INTRAMUSCULAR | Status: DC | PRN
  Administered 2021-12-03: 15 mg via INTRAVENOUS

## 2021-12-03 MED ORDER — FENTANYL CITRATE (PF) 100 MCG/2ML IJ SOLN
INTRAMUSCULAR | Status: AC
  Filled 2021-12-03: qty 2

## 2021-12-03 MED ORDER — DEXAMETHASONE SODIUM PHOSPHATE 10 MG/ML IJ SOLN
INTRAMUSCULAR | Status: DC | PRN
  Administered 2021-12-03: 5 mg via INTRAVENOUS

## 2021-12-03 MED ORDER — ONDANSETRON HCL 4 MG/2ML IJ SOLN: 4.0000 mg | Freq: Once | INTRAMUSCULAR | Status: DC | PRN

## 2021-12-03 MED ORDER — LACTATED RINGERS IV SOLN: INTRAVENOUS | Status: DC

## 2021-12-03 MED ORDER — BUPIVACAINE LIPOSOME 1.3 % IJ SUSP
INTRAMUSCULAR | Status: AC
  Filled 2021-12-03: qty 20

## 2021-12-03 MED ORDER — LIDOCAINE HCL (PF) 2 % IJ SOLN
INTRAMUSCULAR | Status: AC
  Filled 2021-12-03: qty 5

## 2021-12-03 MED ORDER — BUPIVACAINE-EPINEPHRINE 0.5% -1:200000 IJ SOLN
INTRAMUSCULAR | Status: DC | PRN
  Administered 2021-12-03: 20 mL

## 2021-12-03 MED ORDER — CEFAZOLIN SODIUM-DEXTROSE 2-4 GM/100ML-% IV SOLN
INTRAVENOUS | Status: AC
  Filled 2021-12-03: qty 100

## 2021-12-03 MED ORDER — CHLORHEXIDINE GLUCONATE 0.12 % MT SOLN: 15.0000 mL | Freq: Once | OROMUCOSAL | Status: AC

## 2021-12-03 MED ORDER — LIDOCAINE HCL (CARDIAC) PF 100 MG/5ML IV SOSY
PREFILLED_SYRINGE | INTRAVENOUS | Status: DC | PRN
  Administered 2021-12-03: 80 mg via INTRAVENOUS

## 2021-12-03 MED ORDER — DEXMEDETOMIDINE (PRECEDEX) IN NS 20 MCG/5ML (4 MCG/ML) IV SYRINGE
PREFILLED_SYRINGE | INTRAVENOUS | Status: DC | PRN
  Administered 2021-12-03 (×4): 4 ug via INTRAVENOUS

## 2021-12-03 MED ORDER — CHLORHEXIDINE GLUCONATE CLOTH 2 % EX PADS: 6.0000 | MEDICATED_PAD | Freq: Once | CUTANEOUS | Status: DC

## 2021-12-03 MED ORDER — PROPOFOL 10 MG/ML IV BOLUS
INTRAVENOUS | Status: AC
  Filled 2021-12-03: qty 20

## 2021-12-03 MED ORDER — BUPIVACAINE LIPOSOME 1.3 % IJ SUSP
INTRAMUSCULAR | Status: DC | PRN
  Administered 2021-12-03: 20 mL

## 2021-12-03 MED ORDER — IBUPROFEN 800 MG PO TABS: 800.0000 mg | ORAL_TABLET | Freq: Three times a day (TID) | ORAL | 0 refills | Status: DC | PRN

## 2021-12-03 MED ORDER — ACETAMINOPHEN 325 MG PO TABS: 650.0000 mg | ORAL_TABLET | Freq: Three times a day (TID) | ORAL | 0 refills | Status: AC | PRN

## 2021-12-03 MED ORDER — GABAPENTIN 300 MG PO CAPS
ORAL_CAPSULE | ORAL | Status: AC
  Administered 2021-12-03: 300 mg via ORAL
  Filled 2021-12-03: qty 1

## 2021-12-03 MED ORDER — BUPIVACAINE-EPINEPHRINE (PF) 0.5% -1:200000 IJ SOLN
INTRAMUSCULAR | Status: AC
  Filled 2021-12-03: qty 30

## 2021-12-03 MED ORDER — ROCURONIUM BROMIDE 10 MG/ML (PF) SYRINGE
PREFILLED_SYRINGE | INTRAVENOUS | Status: AC
  Filled 2021-12-03: qty 10

## 2021-12-03 MED ORDER — MIDAZOLAM HCL 2 MG/2ML IJ SOLN
INTRAMUSCULAR | Status: AC
  Filled 2021-12-03: qty 2

## 2021-12-03 MED ORDER — DOCUSATE SODIUM 100 MG PO CAPS
100.0000 mg | ORAL_CAPSULE | Freq: Two times a day (BID) | ORAL | 0 refills | Status: AC | PRN
Start: 2021-12-03 — End: 2021-12-13

## 2021-12-03 MED ORDER — ACETAMINOPHEN 500 MG PO TABS
ORAL_TABLET | ORAL | Status: AC
  Administered 2021-12-03: 1000 mg via ORAL
  Filled 2021-12-03: qty 2

## 2021-12-03 MED ORDER — FENTANYL CITRATE (PF) 100 MCG/2ML IJ SOLN
INTRAMUSCULAR | Status: AC
  Administered 2021-12-03: 25 ug via INTRAVENOUS
  Filled 2021-12-03: qty 2

## 2021-12-03 MED ORDER — SUGAMMADEX SODIUM 200 MG/2ML IV SOLN
INTRAVENOUS | Status: DC | PRN
  Administered 2021-12-03: 200 mg via INTRAVENOUS

## 2021-12-03 SURGICAL SUPPLY — 51 items
ADH SKN CLS APL DERMABOND .7 (GAUZE/BANDAGES/DRESSINGS) ×1
BAG INFUSER PRESSURE 100CC (MISCELLANEOUS) IMPLANT
BLADE SURG SZ11 CARB STEEL (BLADE) ×3 IMPLANT
BNDG GAUZE ELAST 4 BULKY (GAUZE/BANDAGES/DRESSINGS) ×3 IMPLANT
COVER TIP SHEARS 8 DVNC (MISCELLANEOUS) ×2 IMPLANT
COVER TIP SHEARS 8MM DA VINCI (MISCELLANEOUS) ×2
DERMABOND ADVANCED (GAUZE/BANDAGES/DRESSINGS) ×1
DERMABOND ADVANCED .7 DNX12 (GAUZE/BANDAGES/DRESSINGS) ×2 IMPLANT
DRAPE ARM DVNC X/XI (DISPOSABLE) ×6 IMPLANT
DRAPE COLUMN DVNC XI (DISPOSABLE) ×2 IMPLANT
DRAPE DA VINCI XI ARM (DISPOSABLE) ×6
DRAPE DA VINCI XI COLUMN (DISPOSABLE) ×2
ELECT CAUTERY BLADE 6.4 (BLADE) IMPLANT
ELECT REM PT RETURN 9FT ADLT (ELECTROSURGICAL) ×2
ELECTRODE REM PT RTRN 9FT ADLT (ELECTROSURGICAL) ×2 IMPLANT
GLOVE SURG SYN 6.5 ES PF (GLOVE) ×8 IMPLANT
GLOVE SURG SYN 6.5 PF PI (GLOVE) ×4 IMPLANT
GLOVE SURG UNDER POLY LF SZ7 (GLOVE) ×8 IMPLANT
GOWN STRL REUS W/ TWL LRG LVL3 (GOWN DISPOSABLE) ×6 IMPLANT
GOWN STRL REUS W/TWL LRG LVL3 (GOWN DISPOSABLE) ×8
IRRIGATOR SUCT 8 DISP DVNC XI (IRRIGATION / IRRIGATOR) IMPLANT
IRRIGATOR SUCTION 8MM XI DISP (IRRIGATION / IRRIGATOR)
IV NS 1000ML (IV SOLUTION)
IV NS 1000ML BAXH (IV SOLUTION) IMPLANT
LABEL OR SOLS (LABEL) IMPLANT
MANIFOLD NEPTUNE II (INSTRUMENTS) ×3 IMPLANT
MESH 3DMAX 4X6 LT LRG (Mesh General) ×1 IMPLANT
MESH 3DMAX 4X6 RT LRG (Mesh General) ×1 IMPLANT
MESH 3DMAX MID 4X6 LT LRG (Mesh General) IMPLANT
MESH 3DMAX MID 4X6 RT LRG (Mesh General) IMPLANT
NDL INSUFFLATION 14GA 120MM (NEEDLE) ×2 IMPLANT
NEEDLE HYPO 22GX1.5 SAFETY (NEEDLE) ×3 IMPLANT
NEEDLE INSUFFLATION 14GA 120MM (NEEDLE) ×2 IMPLANT
OBTURATOR OPTICAL STANDARD 8MM (TROCAR) ×2
OBTURATOR OPTICAL STND 8 DVNC (TROCAR) ×1
OBTURATOR OPTICALSTD 8 DVNC (TROCAR) ×2 IMPLANT
PACK LAP CHOLECYSTECTOMY (MISCELLANEOUS) ×3 IMPLANT
PENCIL ELECTRO HAND CTR (MISCELLANEOUS) ×3 IMPLANT
SEAL CANN UNIV 5-8 DVNC XI (MISCELLANEOUS) ×6 IMPLANT
SEAL XI 5MM-8MM UNIVERSAL (MISCELLANEOUS) ×6
SET TUBE SMOKE EVAC HIGH FLOW (TUBING) ×3 IMPLANT
SOLUTION ELECTROLUBE (MISCELLANEOUS) ×3 IMPLANT
SUT MNCRL 4-0 (SUTURE) ×2
SUT MNCRL 4-0 27XMFL (SUTURE) ×1
SUT VIC AB 2-0 SH 27 (SUTURE) ×4
SUT VIC AB 2-0 SH 27XBRD (SUTURE) ×2 IMPLANT
SUT VLOC 90 6 CV-15 VIOLET (SUTURE) ×6 IMPLANT
SUTURE MNCRL 4-0 27XMF (SUTURE) ×2 IMPLANT
SYR 30ML LL (SYRINGE) ×3 IMPLANT
TAPE TRANSPORE STRL 2 31045 (GAUZE/BANDAGES/DRESSINGS) ×3 IMPLANT
WATER STERILE IRR 500ML POUR (IV SOLUTION) ×2 IMPLANT

## 2021-12-03 NOTE — Interval H&P Note (Signed)
History and Physical Interval Note: ? ?12/03/2021 ?7:43 AM ? 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  The various methods of treatment have been discussed with the patient and family. After consideration of risks, benefits and other options for treatment, the patient has consented to  Procedure(s): ?XI ROBOTIC ASSISTED BILATERAL INGUINAL HERNIA (Bilateral) as a surgical intervention.  The patient's history has been reviewed, patient examined, no change in status, stable for surgery.  I have reviewed the patient's chart and labs.  Questions were answered to the patient's satisfaction.   ? ? ?[name] ? ? ?

## 2021-12-03 NOTE — Op Note (Signed)
Preoperative diagnosis: bilateral, initial, reducible inguinal Hernia. ? ?Postoperative diagnosis: same ? ?Procedure: Robotic assisted laparoscopic bilateral inguinal hernia repair with mesh ? ?Anesthesia: General ? ?Surgeon: Dr. Tonna Boehringer ? ?Wound Classification: Clean ? ?Specimen: none ? ?Complications: None ? ?Estimated Blood Loss: 26mL ? ? ?Indications:  inguinal hernia. Repair was indicated to avoid complications of incarceration, obstruction and pain, and a prosthetic mesh repair was elected.  See H&P for further details. ? ?Findings: ?1. Vas Deferens and cord structures identified and preserved ?2. Bard 3D max medium weight mesh used for repair ?3. Adequate hemostasis achieved ? ?Description of procedure: ?The patient was taken to the operating room. A time-out was completed verifying correct patient, procedure, site, positioning, and implant(s) and/or special equipment prior to beginning this procedure.  Area was prepped and draped in the usual sterile fashion. An incision was marked 20 cm above the pubic tubercle, slightly above the umbilicus  Scrotum wrapped in Kerlix roll. ? ?Veress needle inserted at palmer's point.  Saline drop test noted to be positive with gradual increase in pressure after initiation of gas insufflation.  15 mm of pressure was achieved prior to removing the Veress needle and then placing a 8 mm port via the Optiview technique through the supraumbilical site.  Inspection of the area afterwards noted no injury to the surrounding organs during insertion of the needle and the port.  2 port sites were marked 8 cm to the lateral sides of the initial port, and a 8 mm robotic port was placed on the left side, another 8 mm robotic port on the right side under direct supervision.  Local anesthesia  infused to the preplanned incision sites prior to insertion of the port.  The [employer] platform was then brought into the operative field and docked to the ports. ? ?Examination of the abdominal  cavity noted a bilateral inguinal hernia.   ?Right side addressed first. A peritoneal flap was created approximately 8cm cephalad to the defect by using scissors with electrocautery.  Dissection was carried down towards the pubic tubercle, developing the myopectineal orifice view.  Laterally the flap was carried towards the ASIS.  Small hernia sac was noted, which carefully dissected away from the adjacent tissues to be fully reduced out of hernia cavity.  Any bleeding was controlled with combination of electrocautery and manual pressure.   ? ?After confirming adequate dissection and the peritoneal reflection completely down and away from the cord structures, a Large Bard 3DMax medium weight mesh was placed within the anterior abdominal wall, secured in place using 2-0 Vicryl on an SH needle immediately above the pubic tubercle.  After noting proper placement of the mesh with the peritoneal reflection deep to it, the previously created peritoneal flap was secured back up to the anterior abdominal wall using running 3-0 V-Lock.  Both needles were then removed out of the abdominal cavity. ? ?Left side addressed next. A peritoneal flap was created approximately 8cm cephalad to the defect by using scissors with electrocautery.  Dissection was carried down towards the pubic tubercle, developing the myopectineal orifice view.  Laterally the flap was carried towards the ASIS.  Small hernia sac was noted, which carefully dissected away from the adjacent tissues to be fully reduced out of hernia cavity.  Any bleeding was controlled with combination of electrocautery and manual pressure.   ? ?After confirming adequate dissection and the peritoneal reflection completely down and away from the cord structures, a Large Bard 3DMax medium weight mesh was placed within  the anterior abdominal wall, secured in place using 2-0 Vicryl on an SH needle immediately above the pubic tubercle.  After noting proper placement of the mesh with  the peritoneal reflection deep to it, the previously created peritoneal flap was secured back up to the anterior abdominal wall using running 3-0 V-Lock.  Both needles were then removed out of the abdominal cavity, Xi platform undocked from the ports and removed off of operative field.  exparel infused as ilioinguinal block. ? ?Abdomen then desufflated and ports removed. All the skin incisions were then closed with a subcuticular stitch of Monocryl 4-0. Dermabond was applied. The testis was gently pulled down into its anatomic position in the scrotum.  The patient tolerated the procedure well and was taken to the postanesthesia care unit in stable condition. Sponge and instrument count correct at end of procedure. ? ?

## 2021-12-03 NOTE — Transfer of Care (Signed)
Immediate Anesthesia Transfer of Care Note ? ?Patient: Rodney Webb ? ?Procedure(s) Performed: XI ROBOTIC ASSISTED BILATERAL INGUINAL HERNIA (Bilateral: Groin) ? ?Patient Location: PACU ? ?Anesthesia Type:General ? ?Level of Consciousness: drowsy and patient cooperative ? ?Airway & Oxygen Therapy: Patient Spontanous Breathing and Patient connected to face mask oxygen ? ?Post-op Assessment: Report given to RN and Post -op Vital signs reviewed and stable ? ?Post vital signs: Reviewed and stable ? ?Last Vitals:  ?Vitals Value Taken Time  ?BP 137/89 12/03/21 0933  ?Temp    ?Pulse 76 12/03/21 0934  ?Resp 15 12/03/21 0934  ?SpO2 99 % 12/03/21 0934  ?Vitals shown include unvalidated device data. ? ?Last Pain:  ?Vitals:  ? 12/03/21 0644  ?TempSrc: Temporal  ?PainSc: 0-No pain  ?   ? ?  ? ?Complications: No notable events documented. ?

## 2021-12-03 NOTE — Anesthesia Procedure Notes (Signed)
Procedure Name: Intubation ?Date/Time: 12/03/2021 7:58 AM ?Performed by: Jerrye Noble, CRNA ?Pre-anesthesia Checklist: Patient identified, Emergency Drugs available, Suction available and Patient being monitored ?Patient Re-evaluated:Patient Re-evaluated prior to induction ?Oxygen Delivery Method: Circle system utilized ?Preoxygenation: Pre-oxygenation with 100% oxygen ?Induction Type: IV induction ?Ventilation: Mask ventilation without difficulty ?Laryngoscope Size: McGraph and 4 ?Grade View: Grade I ?Tube type: Oral ?Tube size: 7.5 mm ?Number of attempts: 1 ?Airway Equipment and Method: Stylet, Oral airway and Video-laryngoscopy ?Placement Confirmation: ETT inserted through vocal cords under direct vision, positive ETCO2 and breath sounds checked- equal and bilateral ?Secured at: 23 cm ?Tube secured with: Tape ?Dental Injury: Teeth and Oropharynx as per pre-operative assessment  ? ? ? ? ?

## 2021-12-03 NOTE — H&P (Signed)
Subjective:  ? ?CC: Non-recurrent unilateral inguinal hernia without obstruction or gangrene [K40.90] ? ?HPI: ?Rodney Webb is a 66 y.o. male who was referred by Wilford Corner, * for evaluation of above. Symptoms were first noted 2 weeks ago. Pain is sharp and intermittent, confined to the right groin, without radiation. Associated with possible GI changes and dysuria, exacerbated by exertion. Lump is reducible.  ? ?Past Medical History: has a past medical history of COVID-19, Gastric polyp, GERD (gastroesophageal reflux disease), Hypertension, and Sleep apnea/ CPAP. ? ?Past Surgical History:  ?Past Surgical History:  ?Procedure Laterality Date  ? COLONOSCOPY 12/25/2015  ?Negative colon biopsy for colitis/Repeat 37yrs/PYO  ? EGD 12/25/2015  ?GERD/nodule - rpt 1 yr if nodule persists/[facility]  ? ESOPHAGOGASTRODOUDENOSCOPY N/A 02/26/2020  ?Procedure: ESOPHAGOGASTRODUODENOSCOPY, FLEXIBLE, TRANSORAL; WITH ENDOSCOPIC MUCOSAL RESECTION; Surgeon: Jorene Minors, MD; Location: [facility]; Service: Gastroenterology; Laterality: N/A;  ? TONSILLECTOMY Bilateral  ? ?Family History: family history includes Diabetes in his mother and sister; Kidney failure in his mother; Reflux disease in his sister; Stroke in his father. ? ?Social History: reports that he has quit smoking. His smoking use included cigarettes. He has a 1.50 pack-year smoking history. He has never used smokeless tobacco. He reports that he does not drink alcohol and does not use drugs. ? ?Current Medications: has a current medication list which includes the following prescription(s): azelastine, cholecalciferol, esomeprazole, fexofenadine, herbal supplement, latanoprost, multivit-minerals/herbal 121, olmesartan, tadalafil, and zinc. ? ?Allergies:  ?Allergies as of 11/20/2021 - Reviewed 11/20/2021  ?Allergen Reaction Noted  ? Penicillin Itching 07/03/2015  ? Sulfa (sulfonamide antibiotics) Itching 07/03/2015  ? ?ROS:  ?A 15 point review of  systems was performed and pertinent positives and negatives noted in HPI ? ?Objective:  ? ? ?BP (!) 140/92  Pulse 65  Ht 185.4 cm (6\' 1" )  Wt 87.5 kg (193 lb)  BMI 25.46 kg/m?  ? ?Constitutional : Alert, cooperative, no distress  ?Lymphatics/Throat: Supple, no lymphadenopathy  ?Respiratory: clear to auscultation bilaterally  ?Cardiovascular: regular rate and rhythm  ?Gastrointestinal: soft, non-tender; bowel sounds normal; no masses, no organomegaly. inguinal hernia noted. small, reducible, no overlying skin changes and right, possible left  ?Musculoskeletal: Steady gait and movement  ?Skin: Cool and moist, no surgical scars  ?Psychiatric: Normal affect, non-agitated, not confused  ? ? ?LABS:  ?n/a  ? ?RADS: ?n/a ?Assessment:  ? ? ?Non-recurrent unilateral inguinal hernia without obstruction or gangrene [K40.90], right ? ?Plan:  ? ? ?1. Non-recurrent unilateral inguinal hernia without obstruction or gangrene [K40.90]  ?Discussed the risk of surgery including recurrence, which can be up to 50% in the case of incisional or complex hernias, possible use of prosthetic materials (mesh) and the increased risk of mesh infxn if used, bleeding, chronic pain, post-op infxn, post-op SBO or ileus, and possible re-operation to address said risks. The risks of general anesthetic, if used, includes MI, CVA, sudden death or even reaction to anesthetic medications also discussed. Alternatives include continued observation. Benefits include possible symptom relief, prevention of incarceration, strangulation, enlargement in size over time, and the risk of emergency surgery in the face of strangulation.  ? ?Typical post-op recovery time of 3-5 days with 2 weeks of activity restrictions were also discussed. ? ?ED return precautions given for sudden increase in pain, size of hernia with accompanying fever, nausea, and/or vomiting. ? ?The patient verbalized understanding and all questions were answered to the patient's  satisfaction. ? ?2. Patient has elected to proceed with surgical treatment. Procedure will be  scheduled. Right, possible left, robotic assisted laparoscopic ? ?labs/images/medications/previous chart entries reviewed personally and relevant changes/updates noted above. ?

## 2021-12-03 NOTE — Discharge Instructions (Signed)

## 2021-12-03 NOTE — Anesthesia Preprocedure Evaluation (Addendum)
Anesthesia Evaluation  ?Patient identified by MRN, date of birth, ID band ?Patient awake ? ? ? ?Reviewed: ?Allergy & Precautions, NPO status , Patient's Chart, lab work & pertinent test results ? ?History of Anesthesia Complications ?Negative for: history of anesthetic complications ? ?Airway ?Mallampati: I ? ? ?Neck ROM: Full ? ? ? Dental ? ?(+) Dental Advidsory Given, Teeth Intact, Chipped, Caps ?  ?Pulmonary ?neg shortness of breath, sleep apnea , neg recent URI,  ?  ?Pulmonary exam normal ?breath sounds clear to auscultation ? ? ? ? ? ? Cardiovascular ?Exercise Tolerance: Good ?hypertension, (-) angina(-) Past MI and (-) Cardiac Stents Normal cardiovascular exam(-) dysrhythmias (-) Valvular Problems/Murmurs ?Rhythm:Regular Rate:Normal ? ? ?  ?Neuro/Psych ?Vertigo; HOH ?negative neurological ROS ? negative psych ROS  ? GI/Hepatic ?Neg liver ROS, GERD  ,  ?Endo/Other  ?negative endocrine ROS ? Renal/GU ?negative Renal ROS  ? ?  ?Musculoskeletal ? ?(+) Arthritis ,  ? Abdominal ?  ?Peds ? Hematology ?negative hematology ROS ?(+)   ?Anesthesia Other Findings ?Past Medical History: ?No date: Arthritis ?    Comment:  wrists ?No date: Esophageal polyp ?No date: GERD (gastroesophageal reflux disease) ?No date: Hard of hearing ?    Comment:  both ears ?No date: Hyperlipemia ?No date: Hypertension ?No date: Insomnia ?No date: NAFLD (nonalcoholic fatty liver disease) ?No date: Overweight ?No date: Sleep apnea ?    Comment:  uses CPAP pt has tried the CPAP not working out ?No date: Vertigo ?    Comment:  no episodes for 3-4 months ? ? Reproductive/Obstetrics ?negative OB ROS ? ?  ? ? ? ? ? ? ? ? ? ? ? ? ? ?  ?  ? ? ? ? ? ? ? ?Anesthesia Physical ? ?Anesthesia Plan ? ?ASA: 2 ? ?Anesthesia Plan: General  ? ?Post-op Pain Management:   ? ?Induction: Intravenous ? ?PONV Risk Score and Plan: 2 and Ondansetron, Dexamethasone, Midazolam and Treatment may vary due to age or medical  condition ? ?Airway Management Planned: Oral ETT ? ?Additional Equipment:  ? ?Intra-op Plan:  ? ?Post-operative Plan: Extubation in OR ? ?Informed Consent: I have reviewed the patients History and Physical, chart, labs and discussed the procedure including the risks, benefits and alternatives for the proposed anesthesia with the patient or authorized representative who has indicated his/her understanding and acceptance.  ? ? ? ? ? ?Plan Discussed with: CRNA ? ?Anesthesia Plan Comments:   ? ? ? ? ? ? ?Anesthesia Quick Evaluation ? ?

## 2021-12-05 NOTE — Anesthesia Postprocedure Evaluation (Signed)
Anesthesia Post Note ? ?Patient: Rodney Webb ? ?Procedure(s) Performed: XI ROBOTIC ASSISTED BILATERAL INGUINAL HERNIA (Bilateral: Groin) ? ?Patient location during evaluation: PACU ?Anesthesia Type: General ?Level of consciousness: awake and alert ?Pain management: pain level controlled ?Vital Signs Assessment: post-procedure vital signs reviewed and stable ?Respiratory status: spontaneous breathing, nonlabored ventilation, respiratory function stable and patient connected to nasal cannula oxygen ?Cardiovascular status: blood pressure returned to baseline and stable ?Postop Assessment: no apparent nausea or vomiting ?Anesthetic complications: no ? ? ?No notable events documented. ? ? ?Last Vitals:  ?Vitals:  ? 12/03/21 1030 12/03/21 1056  ?BP: 112/67 130/90  ?Pulse: 61 62  ?Resp: 11 14  ?Temp: (!) 36.1 ?C (!) 36.1 ?C  ?SpO2: 91% 94%  ?  ?Last Pain:  ?Vitals:  ? 12/04/21 0822  ?TempSrc:   ?PainSc: 0-No pain  ? ? ?  ?  ?  ?  ?  ?  ? ?Lenard Simmer ? ? ? ? ?

## 2023-07-19 ENCOUNTER — Other Ambulatory Visit: Payer: Self-pay | Admitting: Internal Medicine

## 2023-07-19 DIAGNOSIS — R0602 Shortness of breath: Secondary | ICD-10-CM

## 2023-07-25 ENCOUNTER — Ambulatory Visit
Admission: RE | Admit: 2023-07-25 | Discharge: 2023-07-25 | Disposition: A | Discharge status: Home / Self Care | Payer: Medicare Other | Source: Ambulatory Visit | Attending: Internal Medicine | Admitting: Internal Medicine

## 2023-07-25 DIAGNOSIS — R0602 Shortness of breath: Secondary | ICD-10-CM | POA: Insufficient documentation

## 2024-06-26 ENCOUNTER — Encounter: Payer: Self-pay | Admitting: Ophthalmology

## 2024-06-26 NOTE — Anesthesia Preprocedure Evaluation (Addendum)
 Anesthesia Evaluation  Patient identified by MRN, date of birth, ID band Patient awake    Reviewed: Allergy & Precautions, H&P , NPO status , Patient's Chart, lab work & pertinent test results  Airway Mallampati: III  TM Distance: >3 FB Neck ROM: Full    Dental no notable dental hx.    Pulmonary neg pulmonary ROS, sleep apnea    Pulmonary exam normal breath sounds clear to auscultation       Cardiovascular hypertension, negative cardio ROS Normal cardiovascular exam Rhythm:Regular Rate:Normal  07-25-23 cardiac calcium score IMPRESSION AND RECOMMENDATION:  1. Coronary calcium score of 0.  2. CAC 0, CAC-DRS A0.  3. Continue heart healthy lifestyle and risk factor modification.       Neuro/Psych negative neurological ROS  negative psych ROS   GI/Hepatic negative GI ROS, Neg liver ROS,GERD  ,,  Endo/Other  negative endocrine ROS    Renal/GU negative Renal ROS  negative genitourinary   Musculoskeletal negative musculoskeletal ROS (+) Arthritis ,    Abdominal   Peds negative pediatric ROS (+)  Hematology negative hematology ROS (+)   Anesthesia Other Findings Hypertension  GERD (gastroesophageal reflux disease) Sleep apnea  Arthritis Hard of hearing  Vertigo Insomnia  Hyperlipemia NAFLD (nonalcoholic fatty liver disease) Overweight Esophageal polyp  History of kidney stones    Reproductive/Obstetrics negative OB ROS                              Anesthesia Physical Anesthesia Plan  ASA: 3  Anesthesia Plan: MAC   Post-op Pain Management:    Induction: Intravenous  PONV Risk Score and Plan:   Airway Management Planned: Natural Airway and Nasal Cannula  Additional Equipment:   Intra-op Plan:   Post-operative Plan:   Informed Consent: I have reviewed the patients History and Physical, chart, labs and discussed the procedure including the risks, benefits and  alternatives for the proposed anesthesia with the patient or authorized representative who has indicated his/her understanding and acceptance.     Dental Advisory Given  Plan Discussed with: Anesthesiologist, CRNA and Surgeon  Anesthesia Plan Comments: (Patient consented for risks of anesthesia including but not limited to:  - adverse reactions to medications - damage to eyes, teeth, lips or other oral mucosa - nerve damage due to positioning  - sore throat or hoarseness - Damage to heart, brain, nerves, lungs, other parts of body or loss of life  Patient voiced understanding and assent.)         Anesthesia Quick Evaluation

## 2024-06-27 NOTE — Discharge Instructions (Signed)

## 2024-07-02 ENCOUNTER — Encounter: Payer: Self-pay | Admitting: Ophthalmology

## 2024-07-02 ENCOUNTER — Ambulatory Visit: Payer: Self-pay | Admitting: Anesthesiology

## 2024-07-02 ENCOUNTER — Encounter
Admission: RE | Disposition: A | Discharge status: Home / Self Care | Payer: Self-pay | Source: Home / Self Care | Attending: Ophthalmology

## 2024-07-02 ENCOUNTER — Other Ambulatory Visit: Payer: Self-pay

## 2024-07-02 ENCOUNTER — Ambulatory Visit
Admission: RE | Admit: 2024-07-02 | Discharge: 2024-07-02 | Disposition: A | Discharge status: Home / Self Care | Attending: Ophthalmology | Admitting: Ophthalmology

## 2024-07-02 DIAGNOSIS — H2511 Age-related nuclear cataract, right eye: Secondary | ICD-10-CM | POA: Insufficient documentation

## 2024-07-02 DIAGNOSIS — K219 Gastro-esophageal reflux disease without esophagitis: Secondary | ICD-10-CM | POA: Diagnosis not present

## 2024-07-02 DIAGNOSIS — G4730 Sleep apnea, unspecified: Secondary | ICD-10-CM | POA: Diagnosis not present

## 2024-07-02 DIAGNOSIS — I10 Essential (primary) hypertension: Secondary | ICD-10-CM | POA: Insufficient documentation

## 2024-07-02 DIAGNOSIS — Z87891 Personal history of nicotine dependence: Secondary | ICD-10-CM | POA: Diagnosis not present

## 2024-07-02 HISTORY — PX: CATARACT EXTRACTION W/PHACO: SHX586

## 2024-07-02 HISTORY — DX: Personal history of urinary calculi: Z87.442

## 2024-07-02 SURGERY — PHACOEMULSIFICATION, CATARACT, WITH IOL INSERTION
Anesthesia: Monitor Anesthesia Care | Site: Eye | Laterality: Right

## 2024-07-02 MED ORDER — LIDOCAINE HCL (PF) 2 % IJ SOLN
INTRAOCULAR | Status: DC | PRN
  Administered 2024-07-02: 4 mL via INTRAOCULAR

## 2024-07-02 MED ORDER — LACTATED RINGERS IV SOLN: INTRAVENOUS | Status: DC

## 2024-07-02 MED ORDER — SIGHTPATH DOSE#1 NA HYALUR & NA CHOND-NA HYALUR IO KIT
PACK | INTRAOCULAR | Status: DC | PRN
  Administered 2024-07-02: 1 via OPHTHALMIC

## 2024-07-02 MED ORDER — MIDAZOLAM HCL 2 MG/2ML IJ SOLN
INTRAMUSCULAR | Status: AC
  Filled 2024-07-02: qty 2

## 2024-07-02 MED ORDER — MOXIFLOXACIN HCL 0.5 % OP SOLN
OPHTHALMIC | Status: DC | PRN
  Administered 2024-07-02: .2 mL via OPHTHALMIC

## 2024-07-02 MED ORDER — TETRACAINE HCL 0.5 % OP SOLN
1.0000 [drp] | OPHTHALMIC | Status: DC | PRN
  Administered 2024-07-02 (×3): 1 [drp] via OPHTHALMIC

## 2024-07-02 MED ORDER — FENTANYL CITRATE (PF) 100 MCG/2ML IJ SOLN
INTRAMUSCULAR | Status: AC
  Filled 2024-07-02: qty 2

## 2024-07-02 MED ORDER — FENTANYL CITRATE (PF) 100 MCG/2ML IJ SOLN
INTRAMUSCULAR | Status: DC | PRN
  Administered 2024-07-02: 50 ug via INTRAVENOUS

## 2024-07-02 MED ORDER — SIGHTPATH DOSE#1 BSS IO SOLN
INTRAOCULAR | Status: DC | PRN
  Administered 2024-07-02: 15 mL via INTRAOCULAR

## 2024-07-02 MED ORDER — MIDAZOLAM HCL (PF) 2 MG/2ML IJ SOLN
INTRAMUSCULAR | Status: DC | PRN
  Administered 2024-07-02 (×2): 1 mg via INTRAVENOUS

## 2024-07-02 MED ORDER — SIGHTPATH DOSE#1 BSS IO SOLN
INTRAOCULAR | Status: DC | PRN
  Administered 2024-07-02: 84 mL via OPHTHALMIC

## 2024-07-02 MED ORDER — ARMC OPHTHALMIC DILATING DROPS
1.0000 | OPHTHALMIC | Status: DC | PRN
  Administered 2024-07-02 (×3): 1 via OPHTHALMIC

## 2024-07-02 SURGICAL SUPPLY — 9 items
DISSECTOR HYDRO NUCLEUS 50X22 (MISCELLANEOUS) ×2 IMPLANT
FEE CATARACT SUITE SIGHTPATH (MISCELLANEOUS) ×2 IMPLANT
GLOVE PI ULTRA LF STRL 7.5 (GLOVE) ×2 IMPLANT
GLOVE SURG SYN 6.5 PF PI BL (GLOVE) ×2 IMPLANT
GLOVE SURG SYN 8.5 PF PI BL (GLOVE) ×2 IMPLANT
LENS IOL TECNIS EYHANCE 21.0 (Intraocular Lens) IMPLANT
NDL FILTER BLUNT 18X1 1/2 (NEEDLE) ×2 IMPLANT
NEEDLE FILTER BLUNT 18X1 1/2 (NEEDLE) ×1 IMPLANT
SYR 3ML LL SCALE MARK (SYRINGE) ×2 IMPLANT

## 2024-07-02 NOTE — Anesthesia Postprocedure Evaluation (Signed)
 Anesthesia Post Note  Patient: Merl Bommarito Lutzke  Procedure(s) Performed: PHACOEMULSIFICATION, CATARACT, WITH IOL INSERTION 5.17 00:33.3 (Right: Eye)  Patient location during evaluation: PACU Anesthesia Type: MAC Level of consciousness: awake and alert Pain management: pain level controlled Vital Signs Assessment: post-procedure vital signs reviewed and stable Respiratory status: spontaneous breathing, nonlabored ventilation, respiratory function stable and patient connected to nasal cannula oxygen Cardiovascular status: stable and blood pressure returned to baseline Postop Assessment: no apparent nausea or vomiting Anesthetic complications: no   No notable events documented.   Last Vitals:  Vitals:   07/02/24 1059 07/02/24 1231  BP: (!) 138/91 122/77  Pulse: (!) 59 (!) 58  Resp: 12 13  Temp: 36.7 C (!) 36.2 C  SpO2: 99% 98%    Last Pain:  Vitals:   07/02/24 1231  PainSc: 0-No pain                 Cyrena Kuchenbecker C Ariday Brinker

## 2024-07-02 NOTE — H&P (Signed)
 [facility]   Primary Care Physician:  Cyrus Selinda Moose, PA-C Ophthalmologist: Dr. Adine Novak  Pre-Procedure History & Physical: HPI:  Rodney Webb is a 68 y.o. male here for cataract surgery.   Past Medical History:  Diagnosis Date   Arthritis    wrists   Esophageal polyp    GERD (gastroesophageal reflux disease)    Hard of hearing    both ears   History of kidney stones    Hyperlipemia    Hypertension    Insomnia    NAFLD (nonalcoholic fatty liver disease)    Overweight    Sleep apnea    uses CPAP   Vertigo    no episodes for 3-4 months    Past Surgical History:  Procedure Laterality Date   COLONOSCOPY WITH PROPOFOL  N/A 12/25/2015   Procedure: COLONOSCOPY WITH PROPOFOL ;  Surgeon: Deward CINDERELLA Piedmont, MD;  Location: [facility];  Service: Gastroenterology;  Laterality: N/A;   ESOPHAGOGASTRODUODENOSCOPY (EGD) WITH PROPOFOL  N/A 12/25/2015   Procedure: ESOPHAGOGASTRODUODENOSCOPY (EGD) WITH PROPOFOL ;  Surgeon: Deward CINDERELLA Piedmont, MD;  Location: [facility];  Service: Gastroenterology;  Laterality: N/A;   ESOPHAGOGASTRODUODENOSCOPY (EGD) WITH PROPOFOL  N/A 10/06/2018   Procedure: ESOPHAGOGASTRODUODENOSCOPY (EGD) WITH PROPOFOL ;  Surgeon: Jinny Carmine, MD;  Location: [facility];  Service: Endoscopy;  Laterality: N/A;  sleep apnea   POLYPECTOMY  10/06/2018   Procedure: POLYPECTOMY;  Surgeon: Jinny Carmine, MD;  Location: [facility];  Service: Endoscopy;;  gastroesophageal nodule with polyp   TONSILLECTOMY      Prior to Admission medications   Medication Sig Start Date End Date Taking? Authorizing Provider  ascorbic acid (VITAMIN C) 500 MG tablet Take 500 mg by mouth daily.   Yes [provider]  cyanocobalamin (VITAMIN B12) 1000 MCG tablet Take 1,000 mcg by mouth daily.   Yes [provider]  ezetimibe (ZETIA) 10 MG tablet Take 10 mg by mouth daily.   Yes [provider]  loratadine (CLARITIN) 10 MG tablet Take 10 mg by mouth daily.    Yes [provider]  Misc Natural Products (PROSTATE HEALTH PO) Take 1 tablet by mouth daily.   Yes [provider]  olmesartan (BENICAR) 20 MG tablet Take 20 mg by mouth in the morning and at bedtime. 10/01/21  Yes [provider]  OVER THE COUNTER MEDICATION Take 1 tablet by mouth daily. Arthro Genix   Yes [provider]  OVER THE COUNTER MEDICATION Take 1 tablet by mouth daily. Neuro-Mag   Yes [provider]  pantoprazole (PROTONIX) 40 MG tablet Take 40 mg by mouth daily.   Yes [provider]  azelastine (ASTELIN) 0.1 % nasal spray Place 1 spray into both nostrils in the morning and at bedtime. 07/06/21   [provider]  Digestive Enzymes (DIGESTIVE ENZYME PO) Take by mouth.    [provider]  Glucosamine-Chondroitin (OSTEO BI-FLEX REGULAR STRENGTH PO) Take 2 tablets by mouth in the morning.    [provider]  latanoprost (XALATAN) 0.005 % ophthalmic solution Place 1 drop into both eyes at bedtime. 04/04/19   [provider]  tadalafil (CIALIS) 5 MG tablet Take 5 mg by mouth at bedtime.    [provider]    Allergies as of 03/23/2024 - Review Complete 12/03/2021  Allergen Reaction Noted   Sulfa antibiotics Rash and Itching 10/23/2015   Lisinopril Cough 05/09/2020   Penicillins Rash 10/23/2015    Family History  Problem Relation Age of Onset   Diabetes Mother  Stroke Father     Social History   Socioeconomic History   Marital status: Married    Spouse name: Recardo   Number of children: 2   Years of education: Not on file   Highest education level: Not on file  Occupational History   Not on file  Tobacco Use   Smoking status: Never   Smokeless tobacco: Former    Types: Chew    Quit date: 2000  Vaping Use   Vaping status: Never Used  Substance and Sexual Activity   Alcohol use: No    Alcohol/week: 0.0 standard drinks of alcohol   Drug use: No   Sexual activity: Not  on file  Other Topics Concern   Not on file  Social History Narrative   Not on file   Social Drivers of Health   Financial Resource Strain: Low Risk  (08/18/2023)   Received from [facility]   Overall Financial Resource Strain (CARDIA)    Difficulty of Paying Living Expenses: Not hard at all  Food Insecurity: Not on file  Transportation Needs: Not on file  Physical Activity: Not on file  Stress: Not on file  Social Connections: Not on file  Intimate Partner Violence: Not on file    Review of Systems: See HPI, otherwise negative ROS  Physical Exam: BP (!) 138/91   Pulse (!) 59   Temp 98 F (36.7 C)   Resp 12   Ht 6' 2 (1.88 m)   Wt 84.4 kg   SpO2 99%   BMI 23.88 kg/m  General:   Alert, cooperative. Head:  Normocephalic and atraumatic. Respiratory:  Normal work of breathing. Cardiovascular:  NAD  Impression/Plan: Rodney Webb is here for cataract surgery.  Risks, benefits, limitations, and alternatives regarding cataract surgery have been reviewed with the patient.  Questions have been answered.  All parties agreeable.   Adine Novak, MD  07/02/2024, 11:54 AM

## 2024-07-02 NOTE — Transfer of Care (Signed)
 Immediate Anesthesia Transfer of Care Note  Patient: Rodney Webb  Procedure(s) Performed: PHACOEMULSIFICATION, CATARACT, WITH IOL INSERTION 5.17 00:33.3 (Right: Eye)  Patient Location: PACU  Anesthesia Type: MAC  Level of Consciousness: awake, alert  and patient cooperative  Airway and Oxygen Therapy: Patient Spontanous Breathing and Patient connected to supplemental oxygen  Post-op Assessment: Post-op Vital signs reviewed, Patient's Cardiovascular Status Stable, Respiratory Function Stable, Patent Airway and No signs of Nausea or vomiting  Post-op Vital Signs: Reviewed and stable  Complications: No notable events documented.

## 2024-07-02 NOTE — Op Note (Signed)
 OPERATIVE NOTE  TRAN ARZUAGA [id number] 07/02/2024   PREOPERATIVE DIAGNOSIS:  Nuclear sclerotic cataract right eye.  H25.11   POSTOPERATIVE DIAGNOSIS:    Nuclear sclerotic cataract right eye.     PROCEDURE:  Phacoemusification with posterior chamber intraocular lens placement of the right eye   LENS:   Implant Name Type Inv. Item Serial No. Manufacturer Lot No. LRB No. Used Action  LENS IOL TECNIS EYHANCE 21.0 - [id number] Intraocular Lens LENS IOL TECNIS EYHANCE 21.0 [id number] [employer]  Right 1 Implanted       Procedure(s): PHACOEMULSIFICATION, CATARACT, WITH IOL INSERTION 5.17 00:33.3 (Right)  SURGEON:  Adine Novak, MD, MPH  ANESTHESIOLOGIST: Anesthesiologist: Ola Donny BROCKS, MD CRNA: Myra Lawless, CRNA   ANESTHESIA:  Topical with tetracaine drops augmented with 1% preservative-free intracameral lidocaine .  ESTIMATED BLOOD LOSS: less than 1 mL.   COMPLICATIONS:  None.   DESCRIPTION OF PROCEDURE:  The patient was identified in the holding room and transported to the operating room and placed in the supine position under the operating microscope.  The right eye was identified as the operative eye and it was prepped and draped in the usual sterile ophthalmic fashion.   A 1.0 millimeter clear-corneal paracentesis was made at the 10:30 position. 0.5 ml of preservative-free 1% lidocaine  with epinephrine  was injected into the anterior chamber.  The anterior chamber was filled with viscoelastic.  A 2.4 millimeter keratome was used to make a near-clear corneal incision at the 8:00 position.  A curvilinear capsulorrhexis was made with a cystotome and capsulorrhexis forceps.  Balanced salt solution was used to hydrodissect and hydrodelineate the nucleus.   Phacoemulsification was then used in stop and chop fashion to remove the lens nucleus and epinucleus.  The remaining cortex was then removed using the irrigation and aspiration handpiece. Viscoelastic was then placed into the  capsular bag to distend it for lens placement.  A lens was then injected into the capsular bag.  The remaining viscoelastic was aspirated.   Wounds were hydrated with balanced salt solution.  The anterior chamber was inflated to a physiologic pressure with balanced salt solution.   Intracameral vigamox 0.1 mL undiluted was injected into the eye and a drop placed onto the ocular surface.  No wound leaks were noted.  The patient was taken to the recovery room in stable condition without complications of anesthesia or surgery  Adine Novak 07/02/2024, 12:22 PM

## 2024-07-03 ENCOUNTER — Encounter: Payer: Self-pay | Admitting: Ophthalmology

## 2024-07-12 NOTE — Discharge Instructions (Signed)

## 2024-07-16 ENCOUNTER — Other Ambulatory Visit: Payer: Self-pay

## 2024-07-16 ENCOUNTER — Ambulatory Visit
Admission: RE | Admit: 2024-07-16 | Discharge: 2024-07-16 | Disposition: A | Discharge status: Home / Self Care | Attending: Ophthalmology | Admitting: Ophthalmology

## 2024-07-16 ENCOUNTER — Encounter: Payer: Self-pay | Admitting: Ophthalmology

## 2024-07-16 ENCOUNTER — Encounter
Admission: RE | Disposition: A | Discharge status: Home / Self Care | Payer: Self-pay | Source: Home / Self Care | Attending: Ophthalmology

## 2024-07-16 ENCOUNTER — Ambulatory Visit: Payer: Self-pay | Admitting: Anesthesiology

## 2024-07-16 DIAGNOSIS — K219 Gastro-esophageal reflux disease without esophagitis: Secondary | ICD-10-CM | POA: Insufficient documentation

## 2024-07-16 DIAGNOSIS — Z79899 Other long term (current) drug therapy: Secondary | ICD-10-CM | POA: Insufficient documentation

## 2024-07-16 DIAGNOSIS — G4730 Sleep apnea, unspecified: Secondary | ICD-10-CM | POA: Diagnosis not present

## 2024-07-16 DIAGNOSIS — I10 Essential (primary) hypertension: Secondary | ICD-10-CM | POA: Insufficient documentation

## 2024-07-16 DIAGNOSIS — H2512 Age-related nuclear cataract, left eye: Secondary | ICD-10-CM | POA: Diagnosis present

## 2024-07-16 HISTORY — PX: CATARACT EXTRACTION W/PHACO: SHX586

## 2024-07-16 SURGERY — PHACOEMULSIFICATION, CATARACT, WITH IOL INSERTION
Anesthesia: Monitor Anesthesia Care | Laterality: Left

## 2024-07-16 MED ORDER — FENTANYL CITRATE (PF) 100 MCG/2ML IJ SOLN
INTRAMUSCULAR | Status: AC
  Filled 2024-07-16: qty 2

## 2024-07-16 MED ORDER — CYCLOPENTOLATE HCL 2 % OP SOLN
OPHTHALMIC | Status: AC
  Filled 2024-07-16: qty 2

## 2024-07-16 MED ORDER — PHENYLEPHRINE HCL 10 % OP SOLN
1.0000 [drp] | OPHTHALMIC | Status: AC
  Administered 2024-07-16 (×3): 1 [drp] via OPHTHALMIC

## 2024-07-16 MED ORDER — TETRACAINE HCL 0.5 % OP SOLN
OPHTHALMIC | Status: AC
  Filled 2024-07-16: qty 4

## 2024-07-16 MED ORDER — LIDOCAINE HCL (PF) 2 % IJ SOLN
INTRAOCULAR | Status: DC | PRN
  Administered 2024-07-16: 1 mL via INTRAOCULAR

## 2024-07-16 MED ORDER — SIGHTPATH DOSE#1 NA HYALUR & NA CHOND-NA HYALUR IO KIT
PACK | INTRAOCULAR | Status: DC | PRN
  Administered 2024-07-16: 1 via OPHTHALMIC

## 2024-07-16 MED ORDER — CYCLOPENTOLATE HCL 2 % OP SOLN
1.0000 [drp] | OPHTHALMIC | Status: AC
  Administered 2024-07-16 (×3): 1 [drp] via OPHTHALMIC

## 2024-07-16 MED ORDER — MIDAZOLAM HCL 2 MG/2ML IJ SOLN
INTRAMUSCULAR | Status: AC
  Filled 2024-07-16: qty 2

## 2024-07-16 MED ORDER — FENTANYL CITRATE (PF) 100 MCG/2ML IJ SOLN
INTRAMUSCULAR | Status: DC | PRN
  Administered 2024-07-16: 50 ug via INTRAVENOUS

## 2024-07-16 MED ORDER — EPINEPHRINE PF 1 MG/ML IJ SOLN
INTRAMUSCULAR | Status: DC | PRN
  Administered 2024-07-16: 114 mL via OPHTHALMIC

## 2024-07-16 MED ORDER — MOXIFLOXACIN HCL 0.5 % OP SOLN
OPHTHALMIC | Status: DC | PRN
Start: 2024-07-16 — End: 2024-07-16
  Administered 2024-07-16: .2 mL via OPHTHALMIC

## 2024-07-16 MED ORDER — PHENYLEPHRINE HCL 10 % OP SOLN
OPHTHALMIC | Status: AC
  Filled 2024-07-16: qty 5

## 2024-07-16 MED ORDER — LACTATED RINGERS IV SOLN: INTRAVENOUS | Status: DC

## 2024-07-16 MED ORDER — TETRACAINE HCL 0.5 % OP SOLN
1.0000 [drp] | OPHTHALMIC | Status: DC | PRN
  Administered 2024-07-16 (×3): 1 [drp] via OPHTHALMIC

## 2024-07-16 MED ORDER — MIDAZOLAM HCL (PF) 2 MG/2ML IJ SOLN
INTRAMUSCULAR | Status: DC | PRN
  Administered 2024-07-16: 2 mg via INTRAVENOUS

## 2024-07-16 MED ORDER — SIGHTPATH DOSE#1 BSS IO SOLN
INTRAOCULAR | Status: DC | PRN
  Administered 2024-07-16: 15 mL via INTRAOCULAR

## 2024-07-16 SURGICAL SUPPLY — 9 items
DISSECTOR HYDRO NUCLEUS 50X22 (MISCELLANEOUS) ×2 IMPLANT
FEE CATARACT SUITE SIGHTPATH (MISCELLANEOUS) ×2 IMPLANT
GLOVE PI ULTRA LF STRL 7.5 (GLOVE) ×2 IMPLANT
GLOVE SURG SYN 6.5 PF PI BL (GLOVE) ×2 IMPLANT
GLOVE SURG SYN 8.5 PF PI BL (GLOVE) ×2 IMPLANT
LENS IOL TECNIS EYHANCE 20.5 (Intraocular Lens) IMPLANT
NDL FILTER BLUNT 18X1 1/2 (NEEDLE) ×2 IMPLANT
NEEDLE FILTER BLUNT 18X1 1/2 (NEEDLE) ×1 IMPLANT
SYR 3ML LL SCALE MARK (SYRINGE) ×2 IMPLANT

## 2024-07-16 NOTE — Op Note (Signed)
 OPERATIVE NOTE  [name] [id number] 07/16/2024   PREOPERATIVE DIAGNOSIS:  Nuclear sclerotic cataract left eye.  H25.12   POSTOPERATIVE DIAGNOSIS:    Nuclear sclerotic cataract left eye.     PROCEDURE:  Phacoemusification with posterior chamber intraocular lens placement of the left eye   LENS:   Implant Name Type Inv. Item Serial No. Manufacturer Lot No. LRB No. Used Action  LENS IOL TECNIS EYHANCE 20.5 - [id number] Intraocular Lens LENS IOL TECNIS EYHANCE 20.5 [id number] [employer]  Left 1 Implanted      Procedure(s): PHACOEMULSIFICATION, CATARACT, WITH IOL INSERTION 3.37, 00:29.0 (Left)  SURGEON:  Adine Novak, MD, MPH   ANESTHESIA:  Topical with tetracaine drops augmented with 1% preservative-free intracameral lidocaine .  ESTIMATED BLOOD LOSS: <1 mL   COMPLICATIONS:  None.   DESCRIPTION OF PROCEDURE:  The patient was identified in the holding room and transported to the operating room and placed in the supine position under the operating microscope.  The left eye was identified as the operative eye and it was prepped and draped in the usual sterile ophthalmic fashion.   A 1.0 millimeter clear-corneal paracentesis was made at the 5:00 position. 0.5 ml of preservative-free 1% lidocaine  with epinephrine  was injected into the anterior chamber.  The anterior chamber was filled with viscoelastic.  A 2.4 millimeter keratome was used to make a near-clear corneal incision at the 2:00 position.  A curvilinear capsulorrhexis was made with a cystotome and capsulorrhexis forceps.  Balanced salt solution was used to hydrodissect and hydrodelineate the nucleus.   Phacoemulsification was then used in stop and chop fashion to remove the lens nucleus and epinucleus.  The remaining cortex was then removed using the irrigation and aspiration handpiece. Viscoelastic was then placed into the capsular bag to distend it for lens placement.  A lens was then injected into the capsular bag.  The  remaining viscoelastic was aspirated.   Wounds were hydrated with balanced salt solution.  The anterior chamber was inflated to a physiologic pressure with balanced salt solution.  Intracameral vigamox 0.1 mL undiltued was injected into the eye and a drop placed onto the ocular surface.  No wound leaks were noted.  The patient was taken to the recovery room in stable condition without complications of anesthesia or surgery  Adine Novak 07/16/2024, 12:02 PM

## 2024-07-16 NOTE — Transfer of Care (Signed)
 Immediate Anesthesia Transfer of Care Note  Patient: Rodney Webb  Procedure(s) Performed: PHACOEMULSIFICATION, CATARACT, WITH IOL INSERTION 3.37, 00:29.0 (Left)  Patient Location: PACU  Anesthesia Type: MAC  Level of Consciousness: awake, alert  and patient cooperative  Airway and Oxygen Therapy: Patient Spontanous Breathing and Patient connected to supplemental oxygen  Post-op Assessment: Post-op Vital signs reviewed, Patient's Cardiovascular Status Stable, Respiratory Function Stable, Patent Airway and No signs of Nausea or vomiting  Post-op Vital Signs: Reviewed and stable  Complications: No notable events documented.

## 2024-07-16 NOTE — Anesthesia Preprocedure Evaluation (Signed)
 Anesthesia Evaluation  Patient identified by MRN, date of birth, ID band Patient awake    Reviewed: Allergy & Precautions, H&P , NPO status , Patient's Chart, lab work & pertinent test results  Airway Mallampati: III  TM Distance: >3 FB Neck ROM: Full    Dental no notable dental hx.    Pulmonary neg pulmonary ROS, sleep apnea    Pulmonary exam normal breath sounds clear to auscultation       Cardiovascular hypertension, negative cardio ROS Normal cardiovascular exam Rhythm:Regular Rate:Normal  07-25-23 cardiac calcium score IMPRESSION AND RECOMMENDATION:  1. Coronary calcium score of 0.  2. CAC 0, CAC-DRS A0.  3. Continue heart healthy lifestyle and risk factor modification.       Neuro/Psych negative neurological ROS  negative psych ROS   GI/Hepatic negative GI ROS, Neg liver ROS,GERD  ,,  Endo/Other  negative endocrine ROS    Renal/GU      Musculoskeletal   Abdominal   Peds  Hematology negative hematology ROS (+)   Anesthesia Other Findings Hypertension  GERD (gastroesophageal reflux disease) Sleep apnea  Arthritis Hard of hearing  Vertigo Insomnia  Hyperlipemia NAFLD (nonalcoholic fatty liver disease) Overweight Esophageal polyp  History of kidney stones    Reproductive/Obstetrics negative OB ROS                              Anesthesia Physical Anesthesia Plan  ASA: 2  Anesthesia Plan: MAC   Post-op Pain Management:    Induction: Intravenous  PONV Risk Score and Plan:   Airway Management Planned: Natural Airway and Nasal Cannula  Additional Equipment:   Intra-op Plan:   Post-operative Plan:   Informed Consent: I have reviewed the patients History and Physical, chart, labs and discussed the procedure including the risks, benefits and alternatives for the proposed anesthesia with the patient or authorized representative who has indicated his/her  understanding and acceptance.     Dental Advisory Given  Plan Discussed with: Anesthesiologist, CRNA and Surgeon  Anesthesia Plan Comments: (Patient consented for risks of anesthesia including but not limited to:  - adverse reactions to medications - damage to eyes, teeth, lips or other oral mucosa - nerve damage due to positioning  - sore throat or hoarseness - Damage to heart, brain, nerves, lungs, other parts of body or loss of life  Patient voiced understanding and assent.)        Anesthesia Quick Evaluation

## 2024-07-16 NOTE — H&P (Signed)
 [facility]   Primary Care Physician:  Cyrus Selinda Moose, PA-C Ophthalmologist: Dr. Adine Novak  Pre-Procedure History & Physical: HPI:  Rodney Webb is a 68 y.o. male here for cataract surgery.   Past Medical History:  Diagnosis Date   Arthritis    wrists   Esophageal polyp    GERD (gastroesophageal reflux disease)    Hard of hearing    both ears   History of kidney stones    Hyperlipemia    Hypertension    Insomnia    NAFLD (nonalcoholic fatty liver disease)    Overweight    Sleep apnea    uses CPAP   Vertigo    no episodes for 3-4 months    Past Surgical History:  Procedure Laterality Date   CATARACT EXTRACTION W/PHACO Right 07/02/2024   Procedure: PHACOEMULSIFICATION, CATARACT, WITH IOL INSERTION 5.17 00:33.3;  Surgeon: Novak Adine Anes, MD;  Location: [facility];  Service: Ophthalmology;  Laterality: Right;   COLONOSCOPY WITH PROPOFOL  N/A 12/25/2015   Procedure: COLONOSCOPY WITH PROPOFOL ;  Surgeon: Deward CINDERELLA Piedmont, MD;  Location: [facility] END[facility];  Service: Gastroenterology;  Laterality: N/A;   ESOPHAGOGASTRODUODENOSCOPY (EGD) WITH PROPOFOL  N/A 12/25/2015   Procedure: ESOPHAGOGASTRODUODENOSCOPY (EGD) WITH PROPOFOL ;  Surgeon: Deward CINDERELLA Piedmont, MD;  Location: [facility] ENDOSCOPY;  Service: Gastroenterology;  Laterality: N/A;   ESOPHAGOGASTRODUODENOSCOPY (EGD) WITH PROPOFOL  N/A 10/06/2018   Procedure: ESOPHAGOGASTRODUODENOSCOPY (EGD) WITH PROPOFOL ;  Surgeon: Jinny Carmine, MD;  Location: [facility];  Service: Endoscopy;  Laterality: N/A;  sleep apnea   POLYPECTOMY  10/06/2018   Procedure: POLYPECTOMY;  Surgeon: Jinny Carmine, MD;  Location: [facility];  Service: Endoscopy;;  gastroesophageal nodule with polyp   TONSILLECTOMY      Prior to Admission medications   Medication Sig Start Date End Date Taking? Authorizing Provider  ascorbic acid (VITAMIN C) 500 MG tablet Take 500 mg by mouth daily.   Yes [provider]  azelastine (ASTELIN) 0.1  % nasal spray Place 1 spray into both nostrils in the morning and at bedtime. 07/06/21  Yes [provider]  cyanocobalamin (VITAMIN B12) 1000 MCG tablet Take 1,000 mcg by mouth daily.   Yes [provider]  Digestive Enzymes (DIGESTIVE ENZYME PO) Take by mouth.   Yes [provider]  ezetimibe (ZETIA) 10 MG tablet Take 10 mg by mouth daily.   Yes [provider]  Glucosamine-Chondroitin (OSTEO BI-FLEX REGULAR STRENGTH PO) Take 2 tablets by mouth in the morning.   Yes [provider]  latanoprost (XALATAN) 0.005 % ophthalmic solution Place 1 drop into both eyes at bedtime. 04/04/19  Yes [provider]  loratadine (CLARITIN) 10 MG tablet Take 10 mg by mouth daily.   Yes [provider]  Misc Natural Products (PROSTATE HEALTH PO) Take 1 tablet by mouth daily.   Yes [provider]  olmesartan (BENICAR) 20 MG tablet Take 20 mg by mouth in the morning and at bedtime. 10/01/21  Yes [provider]  OVER THE COUNTER MEDICATION Take 1 tablet by mouth daily. Arthro Genix   Yes [provider]  OVER THE COUNTER MEDICATION Take 1 tablet by mouth daily. Neuro-Mag   Yes [provider]  pantoprazole (PROTONIX) 40 MG tablet Take 40 mg by mouth daily.   Yes [provider]  tadalafil (CIALIS) 5 MG tablet Take 5 mg by mouth at bedtime.   Yes [provider]    Allergies as of 03/23/2024 - Review Complete 12/03/2021  Allergen Reaction  Noted   Sulfa antibiotics Rash and Itching 10/23/2015   Lisinopril Cough 05/09/2020   Penicillins Rash 10/23/2015    Family History  Problem Relation Age of Onset   Diabetes Mother    Stroke Father     Social History   Socioeconomic History   Marital status: Married    Spouse name: Recardo   Number of children: 2   Years of education: Not on file   Highest education level: Not on file  Occupational History   Not on file  Tobacco Use   Smoking status:  Never   Smokeless tobacco: Former    Types: Chew    Quit date: 2000  Vaping Use   Vaping status: Never Used  Substance and Sexual Activity   Alcohol use: No    Alcohol/week: 0.0 standard drinks of alcohol   Drug use: No   Sexual activity: Not on file  Other Topics Concern   Not on file  Social History Narrative   Not on file   Social Drivers of Health   Financial Resource Strain: Low Risk  (08/18/2023)   Received from [facility]   Overall Financial Resource Strain (CARDIA)    Difficulty of Paying Living Expenses: Not hard at all  Food Insecurity: Not on file  Transportation Needs: Not on file  Physical Activity: Not on file  Stress: Not on file  Social Connections: Not on file  Intimate Partner Violence: Not on file    Review of Systems: See HPI, otherwise negative ROS  Physical Exam: BP (!) 126/92   Pulse (!) 55   Temp 98.1 F (36.7 C) (Temporal)   Resp 10   Ht 6' 2 (1.88 m)   Wt 86.2 kg   SpO2 100%   BMI 24.39 kg/m  General:   Alert, cooperative. Head:  Normocephalic and atraumatic. Respiratory:  Normal work of breathing. Cardiovascular:  NAD  Impression/Plan: Rodney Webb is here for cataract surgery.  Risks, benefits, limitations, and alternatives regarding cataract surgery have been reviewed with the patient.  Questions have been answered.  All parties agreeable.   Adine Novak, MD  07/16/2024, 11:12 AM

## 2024-07-17 NOTE — Anesthesia Postprocedure Evaluation (Signed)
 Anesthesia Post Note  Patient: Montay Vanvoorhis Lindon  Procedure(s) Performed: PHACOEMULSIFICATION, CATARACT, WITH IOL INSERTION 3.37, 00:29.0 (Left)  Patient location during evaluation: PACU Anesthesia Type: MAC Level of consciousness: awake and alert Pain management: pain level controlled Vital Signs Assessment: post-procedure vital signs reviewed and stable Respiratory status: spontaneous breathing, nonlabored ventilation, respiratory function stable and patient connected to nasal cannula oxygen Cardiovascular status: blood pressure returned to baseline and stable Postop Assessment: no apparent nausea or vomiting Anesthetic complications: no   No notable events documented.   Last Vitals:  Vitals:   07/16/24 1205 07/16/24 1210  BP: 123/85 132/85  Pulse: 63 (!) 58  Resp: (!) 7 16  Temp:  (!) 36.1 C  SpO2: 96% 96%    Last Pain:  Vitals:   07/16/24 1210  TempSrc:   PainSc: 0-No pain                 Debby Mines

## 2024-10-10 ENCOUNTER — Ambulatory Visit
Admission: RE | Admit: 2024-10-10 | Discharge: 2024-10-10 | Disposition: A | Discharge status: Home / Self Care | Source: Ambulatory Visit | Attending: Physician Assistant | Admitting: Physician Assistant

## 2024-10-10 ENCOUNTER — Other Ambulatory Visit: Payer: Self-pay | Admitting: Physician Assistant

## 2024-10-10 DIAGNOSIS — M898X6 Other specified disorders of bone, lower leg: Secondary | ICD-10-CM | POA: Diagnosis present

## 2024-10-10 DIAGNOSIS — M7989 Other specified soft tissue disorders: Secondary | ICD-10-CM | POA: Insufficient documentation
